# Patient Record
Sex: Female | Born: 1975
Health system: Southern US, Community
[De-identification: ages and names within clinical notes are randomized; demographics above are authoritative.]

## PROBLEM LIST (undated history)

## (undated) HISTORY — PX: CHOLECYSTECTOMY: SHX55

## (undated) HISTORY — PX: REDUCTION MAMMAPLASTY: SUR839

---
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
## 2010-04-09 ENCOUNTER — Ambulatory Visit: Payer: Self-pay | Admitting: Family Medicine

## 2010-04-09 DIAGNOSIS — L919 Hypertrophic disorder of the skin, unspecified: Secondary | ICD-10-CM

## 2010-04-09 DIAGNOSIS — L909 Atrophic disorder of skin, unspecified: Secondary | ICD-10-CM | POA: Insufficient documentation

## 2010-04-27 ENCOUNTER — Encounter: Payer: Self-pay | Admitting: Family Medicine

## 2010-04-30 LAB — CONVERTED CEMR LAB
ALT: 14 units/L (ref 0–35)
AST: 21 units/L (ref 0–37)
Albumin: 4.5 g/dL (ref 3.5–5.2)
Alkaline Phosphatase: 59 units/L (ref 39–117)
BUN: 14 mg/dL (ref 6–23)
CO2: 23 meq/L (ref 19–32)
Calcium: 9.3 mg/dL (ref 8.4–10.5)
Chloride: 103 meq/L (ref 96–112)
Cholesterol: 187 mg/dL (ref 0–200)
Creatinine, Ser: 0.71 mg/dL (ref 0.40–1.20)
Glucose, Bld: 86 mg/dL (ref 70–99)
HDL: 61 mg/dL (ref >39)
LDL Cholesterol: 110 mg/dL — ABNORMAL HIGH (ref 0–99)
Potassium: 4.5 meq/L (ref 3.5–5.3)
Sodium: 141 meq/L (ref 135–145)
Total Bilirubin: 0.6 mg/dL (ref 0.3–1.2)
Total CHOL/HDL Ratio: 3.1
Total Protein: 6.8 g/dL (ref 6.0–8.3)
Triglycerides: 81 mg/dL (ref <150)
VLDL: 16 mg/dL (ref 0–40)

## 2010-05-21 ENCOUNTER — Ambulatory Visit: Payer: Self-pay | Admitting: Family Medicine

## 2010-10-30 NOTE — Assessment & Plan Note (Signed)
Summary: NOV est care/ labs   Vital Signs:  Patient profile:   35 year old female Height:      65 inches Weight:      184 pounds BMI:     30.73 O2 Sat:      96 % on Room air Pulse rate:   78 / minute BP sitting:   124 / 84  (left arm) Cuff size:   regular  Vitals Entered By: Payton Spark CMA (April 09, 2010 3:54 PM)  O2 Flow:  Room air CC: New to est.   Primary Care Provider:  Seymour Bars DO  CC:  New to est..  History of Present Illness: 35 yo WF presents for NOV to est care.  She is G2P2, followed by [facility].  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  She feels well but would like to update her fasting labs.  She has a Mirena IUD for birth control.      Current Medications (verified): 1)  Mirena 20 Mcg/24hr Iud (Levonorgestrel)  Allergies (verified): 1)  ! 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 Married to [place].  Has 2 daughters. Never smoked. 6 ETOH/ wk. Works out 4 x a wk.  Review of Systems       no fevers/sweats/weakness, unexplained wt loss/gain, no change in vision, no difficulty hearing, ringing in ears, no hay fever/allergies, no CP/discomfort, no palpitations, no breast lump/nipple discharge, no cough/wheeze, no blood in stool, no N/V/D, no nocturia, no leaking urine, no unusual vag bleeding, no vaginal/penile discharge, no muscle/joint pain, no rash, no new/changing mole, no HA, no memory loss, no anxiety, no sleep problem, no depression, no unexplained lumps, no easy bruising/bleeding, no concern with sexual function   Physical Exam  General:  alert, well-developed, well-nourished, well-hydrated, and overweight-appearing.   Head:   normocephalic and atraumatic.   Eyes:  conjunctiva clear Mouth:  good dentition and pharynx pink and moist.   Neck:  no masses.   Lungs:  Normal respiratory effort, chest expands symmetrically. Lungs are clear to auscultation, no crackles or wheezes. Heart:  Normal rate and regular rhythm. S1 and S2 normal without gallop, murmur, click, rub or other extra sounds. Skin:  color normal.   mulitple tiny acrododons around neck Cervical Nodes:  No lymphadenopathy noted Psych:  good eye contact, not anxious appearing, and not depressed appearing.     Impression & Recommendations:  Problem # 1:  SKIN TAG (ICD-701.9) Pt will set up OV for skin tag removal in the near future and will update her fasting labs.  Complete Medication List: 1)  Mirena 20 Mcg/24hr Iud (Levonorgestrel)  Other Orders: T-Comprehensive Metabolic Panel [id number]) T-Lipid Profile ([id number])  Patient Instructions: 1)  Update fasting labs one morning at the lab downstairs. 2)  Will call you w/ results. 3)  Return for follow up as needed.

## 2010-10-30 NOTE — Assessment & Plan Note (Signed)
Summary: skin tags removal-jr   Vital Signs:  Patient profile:   35 year old female Height:      65 inches Weight:      188 pounds BMI:     31.40 O2 Sat:      97 % on Room air Pulse rate:   75 / minute BP sitting:   127 / 84  (left arm) Cuff size:   regular  Vitals Entered By: Payton Spark CMA (May 21, 2010 3:55 PM)  O2 Flow:  Room air CC: Skin tag removal   CC:  Skin tag removal.  Current Medications (verified): 1)  Mirena 20 Mcg/24hr Iud (Levonorgestrel)  Allergies (verified): 1)  ! Sulfa   Complete Medication List: 1)  Mirena 20 Mcg/24hr Iud (Levonorgestrel)  Other Orders: Skin Tags Up to 15 any area (11200)  Procedure Note  Skin Tag Removal: The patient complains of irritation. Onset of lesion: years Indication: cosmesis Consent signed: no  Procedure # 1: skin tag(s) removed    Location: neck, chest, L inner thigh, R axilla    # lesions removed: 7    Instrument used: liquid N2    Anesthesia: none    Comment: Pt tolerated procedure well w/ complications.  Will call if any problems.

## 2014-06-22 ENCOUNTER — Ambulatory Visit (INDEPENDENT_AMBULATORY_CARE_PROVIDER_SITE_OTHER): Payer: 59 | Admitting: Physician Assistant

## 2014-06-22 ENCOUNTER — Encounter: Payer: Self-pay | Admitting: Physician Assistant

## 2014-06-22 VITALS — BP 116/75 | HR 67 | Ht 66.0 in | Wt 200.0 lb

## 2014-06-22 DIAGNOSIS — K5900 Constipation, unspecified: Secondary | ICD-10-CM

## 2014-06-22 DIAGNOSIS — Z23 Encounter for immunization: Secondary | ICD-10-CM

## 2014-06-22 DIAGNOSIS — R635 Abnormal weight gain: Secondary | ICD-10-CM

## 2014-06-22 DIAGNOSIS — E669 Obesity, unspecified: Secondary | ICD-10-CM | POA: Insufficient documentation

## 2014-06-22 MED ORDER — LINACLOTIDE 145 MCG PO CAPS: 145.0000 ug | ORAL_CAPSULE | Freq: Every day | ORAL | Status: DC

## 2014-06-22 MED ORDER — PHENTERMINE HCL 37.5 MG PO TABS: 37.5000 mg | ORAL_TABLET | Freq: Every day | ORAL | Status: DC

## 2014-06-22 NOTE — Progress Notes (Signed)
   Subjective:    Patient ID: Sandy Rodriguez, female    DOB: Oct 18, 1975, 38 y.o.   MRN: [id number]  HPI Pt is a 38 yo female who presents to the clinic to establish care.   .. Active Ambulatory Problems    Diagnosis Date Noted  . SKIN TAG 04/09/2010  . Obesity, unspecified 06/22/2014  . Abnormal weight gain 06/22/2014  . CN (constipation) 06/22/2014   Resolved Ambulatory Problems    Diagnosis Date Noted  . No Resolved Ambulatory Problems   No Additional Past Medical History   .Marland Kitchen Family History  Problem Relation Age of Onset  . Hypertension Mother   . Cancer Father     non hodgkins lymphoma  . Stroke Paternal Aunt   . Heart attack Maternal Grandfather   . Stroke Paternal Grandmother    .Marland Kitchen History   Social History  . Marital Status: Married    Spouse Name: N/A    Number of Children: N/A  . Years of Education: N/A   Occupational History  . Not on file.   Social History Main Topics  . Smoking status: Never Smoker   . Smokeless tobacco: Not on file  . Alcohol Use: Yes  . Drug Use: No  . Sexual Activity: Yes   Other Topics Concern  . Not on file   Social History Narrative  . No narrative on file    Pt has ongoing constipation for last 3 years since gallbladder was removed. She occasional has some discomfort but mostly hard stools that occur every 2-5 days. Tried miralax and mag citrate. Helps some. Would like to try medication. There is some straining with bowel movements. Denies hemorrhoids or blood in stool.   She also wants help losing weight. She feels like she is eating better and exercising 2-3 times a week with no results. Never tried anything.    Review of Systems  All other systems reviewed and are negative.      Objective:   Physical Exam  Constitutional: She is oriented to person, place, and time. She appears well-developed and well-nourished.  Obese.   HENT:  Head: Normocephalic and atraumatic.  Cardiovascular: Normal rate, regular rhythm  and normal heart sounds.   Pulmonary/Chest: Effort normal and breath sounds normal.  Abdominal: Soft. Bowel sounds are normal. She exhibits no distension and no mass. There is no tenderness. There is no rebound and no guarding.  Neurological: She is alert and oriented to person, place, and time.  Skin: Skin is dry.  Psychiatric: She has a normal mood and affect. Her behavior is normal.          Assessment & Plan:  Constipation- will try linzess once daily. Gave free coupon card. Follow up in one month. Discussed SE.   Abnormal weight gain/obesity- started phentermine. Discussed diet and exercise with medication. SE discussed. Follow up in one month.   Tdap given without complication.  CPE in one month.

## 2014-07-12 ENCOUNTER — Telehealth: Payer: Self-pay

## 2014-07-12 NOTE — Telephone Encounter (Signed)
Linzess is not covered by insurance. I left a message on patient's phone for her to return call. Jade recommends trying Amitiza.

## 2014-07-13 MED ORDER — LUBIPROSTONE 24 MCG PO CAPS: 24.0000 ug | ORAL_CAPSULE | Freq: Two times a day (BID) | ORAL | Status: DC

## 2014-07-13 NOTE — Telephone Encounter (Signed)
rx sent

## 2014-07-13 NOTE — Telephone Encounter (Signed)
Patient agreed with Sandy Rodriguez's recommendation of Amitiza. Please prescribe. I'm not sure about the dosing.

## 2014-07-19 ENCOUNTER — Telehealth: Payer: Self-pay | Admitting: *Deleted

## 2014-07-19 NOTE — Telephone Encounter (Signed)
Amitiza was denied and Linzess was also denied. What would you want to try now? Corliss SkainsJamie Painter, CMA

## 2014-07-20 ENCOUNTER — Ambulatory Visit (INDEPENDENT_AMBULATORY_CARE_PROVIDER_SITE_OTHER): Payer: 59 | Admitting: Physician Assistant

## 2014-07-20 ENCOUNTER — Encounter: Payer: Self-pay | Admitting: Physician Assistant

## 2014-07-20 VITALS — BP 114/71 | HR 84 | Ht 66.0 in | Wt 191.0 lb

## 2014-07-20 DIAGNOSIS — Z131 Encounter for screening for diabetes mellitus: Secondary | ICD-10-CM

## 2014-07-20 DIAGNOSIS — Z13220 Encounter for screening for lipoid disorders: Secondary | ICD-10-CM

## 2014-07-20 DIAGNOSIS — R635 Abnormal weight gain: Secondary | ICD-10-CM

## 2014-07-20 DIAGNOSIS — Z0000 Encounter for general adult medical examination without abnormal findings: Secondary | ICD-10-CM

## 2014-07-20 DIAGNOSIS — K5900 Constipation, unspecified: Secondary | ICD-10-CM

## 2014-07-20 MED ORDER — PHENTERMINE HCL 37.5 MG PO TABS: 37.5000 mg | ORAL_TABLET | Freq: Every day | ORAL | Status: DC

## 2014-07-20 NOTE — Patient Instructions (Signed)

## 2014-07-20 NOTE — Progress Notes (Signed)
Subjective:    Patient ID: Sandy Rodriguez, female    DOB: 01/08/1976, 38 y.o.   MRN: [id number]  HPI    Review of Systems     Objective:   Physical Exam        Assessment & Plan:   Subjective:     Sandy Rodriguez is a 38 y.o. female and is here for a comprehensive physical exam. The patient reports problems - see below..  Taken linzess for one month. Bowel movements much better at least every 2-3 days but still feels like not completely emptying. It was also not approved by insurance and too expensive. We sent over amitiza but not picked up yet. She has tried miralax daily for years with no benefit.   Pt is also on phentermine and doing great. Denies any side effects except a little dry mouth. Doing a spin class daily and trying to make diet changes. 9lb weight loss this month.   History   Social History  . Marital Status: Married    Spouse Name: N/A    Number of Children: N/A  . Years of Education: N/A   Occupational History  . Not on file.   Social History Main Topics  . Smoking status: Never Smoker   . Smokeless tobacco: Not on file  . Alcohol Use: Yes  . Drug Use: No  . Sexual Activity: Yes   Other Topics Concern  . Not on file   Social History Narrative  . No narrative on file   Health Maintenance  Topic Date Due  . Influenza Vaccine  07/22/2014 (Originally 04/30/2014)  . Pap Smear  01/27/2015  . Tetanus/tdap  06/22/2024    The following portions of the patient's history were reviewed and updated as appropriate: allergies, current medications, past family history, past medical history, past social history, past surgical history and problem list.  Review of Systems A comprehensive review of systems was negative.   Objective:    BP 114/71  Pulse 84  Ht 5\' 6"  (1.676 m)  Wt 191 lb (86.637 kg)  BMI 30.84 kg/m2 General appearance: alert, cooperative and appears stated age Head: Normocephalic, without obvious abnormality, atraumatic Eyes:  conjunctivae/corneas clear. PERRL, EOM's intact. Fundi benign. Ears: normal TM's and external ear canals both ears Nose: Nares normal. Septum midline. Mucosa normal. No drainage or sinus tenderness. Throat: lips, mucosa, and tongue normal; teeth and gums normal Neck: no adenopathy, no carotid bruit, no JVD, supple, symmetrical, trachea midline and thyroid not enlarged, symmetric, no tenderness/mass/nodules Back: symmetric, no curvature. ROM normal. No CVA tenderness. Lungs: clear to auscultation bilaterally Heart: regular rate and rhythm, S1, S2 normal, no murmur, click, rub or gallop Abdomen: soft, non-tender; bowel sounds normal; no masses,  no organomegaly Extremities: extremities normal, atraumatic, no cyanosis or edema Pulses: 2+ and symmetric Skin: Skin color, texture, turgor normal. No rashes or lesions Lymph nodes: Cervical, supraclavicular, and axillary nodes normal. Neurologic: Grossly normal    Assessment:    Healthy female exam.      Plan:    CPE- fasting labs given. Immunizations up to date. Encouraged calcium 1200mg  and vitamin D 800 units. Pt scheduled for pap in next month at OB/GYN.   Constipation- given amitiza card to activate and rx at pharmacy. Discussed this medication is twice a day. Follow up in 2 months.   Abnormal weight gain/obesity- refilled phentermine for 2 months. Nurse visit in 2 months. Encouraged to make diet changes and increase intensity of exercise.  See  After Visit Summary for Counseling Recommendations

## 2014-07-20 NOTE — Telephone Encounter (Signed)
She did not have coupon card for amitiza we are going to try that. She came in for cpe today.

## 2014-07-21 LAB — COMPLETE METABOLIC PANEL WITH GFR
ALT: 11 U/L (ref 0–35)
AST: 17 U/L (ref 0–37)
Albumin: 4.4 g/dL (ref 3.5–5.2)
Alkaline Phosphatase: 44 U/L (ref 39–117)
BUN: 11 mg/dL (ref 6–23)
CO2: 27 mEq/L (ref 19–32)
Calcium: 9.4 mg/dL (ref 8.4–10.5)
Chloride: 105 mEq/L (ref 96–112)
Creat: 0.73 mg/dL (ref 0.50–1.10)
GFR, Est African American: >89 mL/min
GFR, Est Non African American: >89 mL/min
Glucose, Bld: 82 mg/dL (ref 70–99)
Potassium: 4.4 mEq/L (ref 3.5–5.3)
Sodium: 140 mEq/L (ref 135–145)
Total Bilirubin: 0.7 mg/dL (ref 0.2–1.2)
Total Protein: 6.9 g/dL (ref 6.0–8.3)

## 2014-07-21 LAB — LIPID PANEL
Cholesterol: 160 mg/dL (ref 0–200)
HDL: 53 mg/dL (ref >39)
LDL Cholesterol: 97 mg/dL (ref 0–99)
Total CHOL/HDL Ratio: 3 Ratio
Triglycerides: 48 mg/dL (ref <150)
VLDL: 10 mg/dL (ref 0–40)

## 2014-07-21 LAB — TSH: TSH: 0.972 u[IU]/mL (ref 0.350–4.500)

## 2014-09-02 ENCOUNTER — Telehealth: Payer: Self-pay | Admitting: *Deleted

## 2014-09-02 ENCOUNTER — Other Ambulatory Visit: Payer: Self-pay | Admitting: Physician Assistant

## 2014-09-02 NOTE — Telephone Encounter (Signed)
Have you tried magnesium citrate twice a day as needed for constipation?

## 2014-09-02 NOTE — Telephone Encounter (Signed)
Pt.notified

## 2014-09-02 NOTE — Telephone Encounter (Signed)
Spoke with pt this morning regarding her ins denying linzess AND amitiza.  She contacted them herself & got a list of what they will approve and both of those meds are on the list.  I'm having her fax me the info she has and see if Asher MuirJamie can contact them again.  While she was taking the linzess samples she was going to the bathroom and felt better.  She is now back to pretty bad constipation.  She did an enema on Sunday and did have a BM but it wasn't as big as she expected it to be.  She is doing miralax now as well as warm prune juice-neither are helping.  Tried colace in the past but has not tried a straight laxative.  She is wanting to know what suggestions you have while we wait for another prior auth.

## 2015-03-10 ENCOUNTER — Emergency Department (INDEPENDENT_AMBULATORY_CARE_PROVIDER_SITE_OTHER)
Admission: EM | Admit: 2015-03-10 | Discharge: 2015-03-10 | Disposition: A | Discharge status: Home / Self Care | Payer: BLUE CROSS/BLUE SHIELD | Source: Home / Self Care | Attending: Family Medicine | Admitting: Family Medicine

## 2015-03-10 ENCOUNTER — Encounter: Payer: Self-pay | Admitting: *Deleted

## 2015-03-10 DIAGNOSIS — R110 Nausea: Secondary | ICD-10-CM

## 2015-03-10 DIAGNOSIS — R197 Diarrhea, unspecified: Secondary | ICD-10-CM

## 2015-03-10 LAB — POCT CBC W AUTO DIFF (K'VILLE URGENT CARE)

## 2015-03-10 LAB — POCT URINALYSIS DIPSTICK
Bilirubin, UA: NEGATIVE
Blood, UA: NEGATIVE
Glucose, UA: NEGATIVE
Ketones, UA: NEGATIVE
Leukocytes, UA: NEGATIVE
Nitrite, UA: NEGATIVE
Protein, UA: NEGATIVE
Spec Grav, UA: 1.01 (ref 1.005–1.03)
Urobilinogen, UA: 0.2 (ref 0–1)
pH, UA: 7.5 (ref 5–8)

## 2015-03-10 MED ORDER — ONDANSETRON 4 MG PO TBDP: 4.0000 mg | ORAL_TABLET | Freq: Three times a day (TID) | ORAL | Status: DC | PRN

## 2015-03-10 NOTE — ED Provider Notes (Signed)
CSN: [id number]     Arrival date & time 03/10/15  1526 History   First MD Initiated Contact with Patient 03/10/15 1551     Chief Complaint  Patient presents with  . Headache  . Nausea      HPI Comments: Three days ago patient developed headache and fatigue.  Last night she developed myalgias, chills, sweats, nausea without vomiting, and watery diarrhea.  No abdominal pain.  Denies recent foreign travel, or drinking untreated water in a wilderness environment.  She denies recent antibiotic use.   Patient is a 39 y.o. female presenting with diarrhea. The history is provided by the patient.  Diarrhea Quality:  Watery Severity:  Moderate Onset quality:  Sudden Duration:  1 day Timing:  Intermittent Progression:  Unchanged Relieved by:  None tried Exacerbated by: food. Ineffective treatments:  None tried Associated symptoms: chills, diaphoresis and headaches   Associated symptoms: no abdominal pain, no arthralgias, no recent cough, no fever, no myalgias, no URI and no vomiting   Risk factors: no recent antibiotic use, no sick contacts, no suspicious food intake and no travel to endemic areas     History reviewed. No pertinent past medical history. Past Surgical History  Procedure Laterality Date  . Cholecystectomy    . Breast reduction surgery  1998   Family History  Problem Relation Age of Onset  . Hypertension Mother   . Cancer Father     non hodgkins lymphoma  . Stroke Paternal Aunt   . Heart attack Maternal Grandfather   . Stroke Paternal Grandmother    History  Substance Use Topics  . Smoking status: Never Smoker   . Smokeless tobacco: Not on file  . Alcohol Use: Yes   OB History    No data available     Review of Systems  Constitutional: Positive for chills and diaphoresis. Negative for fever.  Gastrointestinal: Positive for diarrhea. Negative for vomiting and abdominal pain.  Musculoskeletal: Negative for myalgias and arthralgias.  Neurological: Positive for  headaches.  All other systems reviewed and are negative.   Allergies  Sulfonamide derivatives  Home Medications   Prior to Admission medications   Medication Sig Start Date End Date Taking? Authorizing Provider  lubiprostone (AMITIZA) 24 MCG capsule Take 1 capsule (24 mcg total) by mouth 2 (two) times daily with a meal. 07/13/14   Agapito Games, MD  Multiple Vitamin (MULTIVITAMIN) tablet Take 1 tablet by mouth daily.    Historical Provider, MD  ondansetron (ZOFRAN ODT) 4 MG disintegrating tablet Take 1 tablet (4 mg total) by mouth every 8 (eight) hours as needed for nausea or vomiting. Dissolve under tongue 03/10/15   Lattie Haw, MD   BP 137/87 mmHg  Pulse 99  Temp(Src) 98.6 F (37 C) (Oral)  Resp 16  Ht 5\' 6"  (1.676 m)  Wt 205 lb (92.987 kg)  BMI 33.10 kg/m2  SpO2 97% Physical Exam Nursing notes and Vital Signs reviewed. Appearance:  Patient appears stated age, and in no acute distress.  Patient is obese (BMI 33.1) Eyes:  Pupils are equal, round, and reactive to light and accomodation.  Extraocular movement is intact.  Conjunctivae are not inflamed  Ears:  Canals normal.  Tympanic membranes normal.  Nose:   Normal turbinates.  No sinus tenderness.   Pharynx:  Normal; moist mucous membranes  Neck:  Supple.  No adenopathy Lungs:  Clear to auscultation.  Breath sounds are equal.  Heart:  Regular rate and rhythm without murmurs, rubs, or gallops.  Abdomen:  Nontender without masses or hepatosplenomegaly.  Bowel sounds are present and increased.  No CVA or flank tenderness.  Extremities:  No edema.  No calf tenderness Skin:  No rash present.   ED Course  Procedures  None    Labs Reviewed  POCT CBC W AUTO DIFF ([facility]):  WBC 9.2; LY 20.9; MO 8.4; GR 70.7; Hgb 14.1; Platelets 254   POCT URINALYSIS DIPSTICK:  Negative.  SG 1.010      MDM   1. Nausea without vomiting; suspect viral gastroenteritis  2. Diarrhea    Zofran ODT  administered PO Rx  for Zofran. Begin clear liquids (Pedialyte while having diarrhea) until improved, then advance to a [employer] (Bananas, Rice, Applesauce, Toast). Then gradually resume a regular diet when tolerated.  Avoid milk products until well.  When stools become more formed, may take Imodium (loperamide) once or twice daily to decrease stool frequency.  If symptoms become significantly worse during the night or over the weekend, proceed to the local emergency room. Followup with Family Doctor if not improved in about four days.    Lattie Haw, MD 03/18/15 [phone]

## 2015-03-10 NOTE — ED Notes (Signed)
Pt c/o HA x 3-4 days. Nausea, chills and sweats since last night/this AM. Taken advil.

## 2015-03-10 NOTE — Discharge Instructions (Signed)
Begin clear liquids (Pedialyte while having diarrhea) until improved, then advance to a BRAT diet (Bananas, Rice, Applesauce, Toast).  Then gradually resume a regular diet when tolerated.  Avoid milk products until well.  When stools become more formed, may take Imodium (loperamide) once or twice daily to decrease stool frequency.  °If symptoms become significantly worse during the night or over the weekend, proceed to the local emergency room.  °

## 2015-03-13 ENCOUNTER — Encounter: Payer: Self-pay | Admitting: Emergency Medicine

## 2015-03-13 ENCOUNTER — Emergency Department (INDEPENDENT_AMBULATORY_CARE_PROVIDER_SITE_OTHER)
Admission: EM | Admit: 2015-03-13 | Discharge: 2015-03-13 | Disposition: A | Discharge status: Home / Self Care | Payer: BLUE CROSS/BLUE SHIELD | Source: Home / Self Care | Attending: Emergency Medicine | Admitting: Emergency Medicine

## 2015-03-13 ENCOUNTER — Telehealth: Payer: Self-pay | Admitting: *Deleted

## 2015-03-13 DIAGNOSIS — R509 Fever, unspecified: Secondary | ICD-10-CM | POA: Diagnosis not present

## 2015-03-13 DIAGNOSIS — E860 Dehydration: Secondary | ICD-10-CM

## 2015-03-13 NOTE — Telephone Encounter (Signed)
The Urgent Care note is not complete so I can't provide any recommendations other than schedule an appointment to further look into her illness.

## 2015-03-13 NOTE — ED Notes (Signed)
Pt c/o severe neck pain, HA, N+V and fever. She was seen here in [facility] Friday for same sxs but states they have gotten much worse/

## 2015-03-13 NOTE — ED Provider Notes (Signed)
CSN: [id number]     Arrival date & time 03/13/15  1508 History   First MD Initiated Contact with Patient 03/13/15 1516    [facility] Chief Complaint  Patient presents with  . Neck Pain   Husband brings her in HPI Worsening fever chills, fatigue, nausea, vomiting, neck pain and stiffness, arthralgias especially in her knees. Diffuse headache that is the worst headache of her life. Onset 3 days ago, progressively worsening, much worse in the past 12 hours. Zofran and ibuprofen helped a little bit. Has not been able to tolerate any by mouth's the past 8 hours. She does not recall any tick bite, but she and husband hike in the woods. Denies any new rash No foreign travel recently. Denies any new abdominal pain. Denies diarrhea. No chest pain or shortness of breath. No coryza. History reviewed. No pertinent past medical history. Past Surgical History  Procedure Laterality Date  . Cholecystectomy    . Breast reduction surgery  1998   Family History  Problem Relation Age of Onset  . Hypertension Mother   . Cancer Father     non hodgkins lymphoma  . Stroke Paternal Aunt   . Heart attack Maternal Grandfather   . Stroke Paternal Grandmother    History  Substance Use Topics  . Smoking status: Never Smoker   . Smokeless tobacco: Not on file  . Alcohol Use: Yes   OB History    No data available     Review of Systems Denies chance of pregnancy. Has IUD No GYN or GU symptoms. Remainder of Review of Systems negative for acute change except as noted in the HPI.  Allergies  Sulfonamide derivatives  Home Medications   Prior to Admission medications   Medication Sig Start Date End Date Taking? Authorizing Provider  lubiprostone (AMITIZA) 24 MCG capsule Take 1 capsule (24 mcg total) by mouth 2 (two) times daily with a meal. 07/13/14   Agapito Games, MD  Multiple Vitamin (MULTIVITAMIN) tablet Take 1 tablet by mouth daily.    Historical Provider, MD   ondansetron (ZOFRAN ODT) 4 MG disintegrating tablet Take 1 tablet (4 mg total) by mouth every 8 (eight) hours as needed for nausea or vomiting. Dissolve under tongue 03/10/15   Lattie Haw, MD   BP 126/86 mmHg  Pulse 108  Temp(Src) 99.7 F (37.6 C) (Oral)  SpO2 99% Physical Exam  Constitutional: She appears well-developed and well-nourished.  Non-toxic appearance. She appears ill (very fatigued, but no cardiorespiratory distress). She appears distressed.  Appears very uncomfortable. Fatigue. Alert, cooperative.  HENT:  Head: Normocephalic and atraumatic.  Right Ear: Tympanic membrane and external ear normal.  Left Ear: Tympanic membrane and external ear normal.  Nose: Rhinorrhea present.  Mouth/Throat: Mucous membranes are dry. No oropharyngeal exudate or posterior oropharyngeal erythema.  Eyes: Conjunctivae are normal. Right eye exhibits no discharge. Left eye exhibits no discharge. No scleral icterus.  Neck: Decreased range of motion present.  Positive Neck rigidity.  Cardiovascular: Normal rate, regular rhythm and normal heart sounds.   Pulmonary/Chest: Breath sounds normal. No stridor. No respiratory distress. She has no wheezes. She has no rales.  Abdominal: Soft. Bowel sounds are normal. There is no tenderness. There is no rigidity, no rebound, no guarding, no CVA tenderness, no tenderness at McBurney's point and negative Murphy's sign.  Musculoskeletal: She exhibits no edema.  Positive for nonspecific tenderness of both knees, but no redness or hot swollen joints  Lymphadenopathy:    She has cervical  adenopathy (mild shoddy anterior cervical nodes).  Neurological: She is alert. She has normal reflexes. No cranial nerve deficit or sensory deficit. She displays no seizure activity.  Skin: Skin is warm and intact. No rash noted. She is diaphoretic.  Psychiatric: She has a normal mood and affect.  Nursing note and vitals reviewed.   ED Course  Procedures (including critical  care time) Labs Review Labs Reviewed - No data to display  Imaging Review No results found.   MDM   1. Febrile illness, acute   2. Dehydration    in the differential is meningitis, RMSF, Lyme's disease, influenza-like illness, or other viral illnesses. Explained to patient and husband that she needs further evaluation and treatment at [facility] emergency room, that she may need CT scan of head, lumbar puncture, and a variety of other tests, as well as IV fluids for dehydration. Patient and husband declined ambulance. Husband driving her right now to [facility] emergency room.    Lajean Manes, MD 03/13/15 [phone]

## 2015-03-13 NOTE — Telephone Encounter (Signed)
Pt left vm stating that she was seen in [facility] on Friday and is feeling no better.  She stated that she feels body aches and like she's "been hit by a truck".  Any recommendations for her other than try to get a f/u appt? Please advise.

## 2015-06-13 ENCOUNTER — Ambulatory Visit (INDEPENDENT_AMBULATORY_CARE_PROVIDER_SITE_OTHER): Payer: BLUE CROSS/BLUE SHIELD | Admitting: Physician Assistant

## 2015-06-13 ENCOUNTER — Encounter: Payer: Self-pay | Admitting: Physician Assistant

## 2015-06-13 VITALS — BP 128/91 | HR 68 | Ht 66.0 in | Wt 205.0 lb

## 2015-06-13 DIAGNOSIS — J309 Allergic rhinitis, unspecified: Secondary | ICD-10-CM

## 2015-06-13 DIAGNOSIS — K5900 Constipation, unspecified: Secondary | ICD-10-CM

## 2015-06-13 DIAGNOSIS — E669 Obesity, unspecified: Secondary | ICD-10-CM

## 2015-06-13 DIAGNOSIS — R635 Abnormal weight gain: Secondary | ICD-10-CM | POA: Diagnosis not present

## 2015-06-13 MED ORDER — LINACLOTIDE 145 MCG PO CAPS: 145.0000 ug | ORAL_CAPSULE | Freq: Every day | ORAL | Status: DC

## 2015-06-13 MED ORDER — LORCASERIN HCL 10 MG PO TABS: ORAL_TABLET | ORAL | Status: DC

## 2015-06-13 MED ORDER — PREDNISONE 20 MG PO TABS: ORAL_TABLET | ORAL | Status: DC

## 2015-06-13 NOTE — Patient Instructions (Signed)
flonase 2 sprays each nostril.  Prednisone taper.  zyrtec

## 2015-06-13 NOTE — Progress Notes (Addendum)
   Subjective:    Patient ID: Sandy Rodriguez, female    DOB: 1975-10-27, 39 y.o.   MRN: [id number]  HPI  Patient presents to clinic today with complaints of constipation and sinus pain. She reports an average of 1-3 bowel movements per week with hard stools. She states bowel movements require strenuous effort and are often painful. She has a diet high in fiber and drinks plenty of water. She drinks prune juice regularly and uses miralax with minimal relief. She has tried linzess in the past with relief of symptoms, but discontinued medication due to insurance coverage reasons. She has experienced constipation since childhood, but states symptoms have increased since having children. She denies N/V/D. Patient is experiencing sinus pain and tenderness without sneezing, discharge, or congestion. She states sinus pain is exacerbated by changing position, and localizes to her hard palate. She was diagnosed with sinusitis in ED in June of this year, which was confirmed by a CT scan. She reports today's symptoms as similar to that time. She was treated with 4 days of prednisone and 10 days of erythromycin. She denies sneezing, itching, and nasal drainage.     Review of Systems Positive for symptoms listed in the HPI    Objective:   Physical Exam  Cardiovascular: Normal rate, regular rhythm and normal heart sounds.   Pulmonary/Chest: Effort normal and breath sounds normal.   Eyes: PERRLA Nose: No discharge observed. Frontal and maxillary sinuses tender to palpation.  Mouth: No pharyngeal erythema or exudate observed GI: Not able to auscultate bowel sounds. All four quadrants tender to palpation.        Assessment & Plan:  Constipation- Initiate treatment with linzess. Pt has previously tolerated linzess but no approved via cost. Follow-up in 6-8 weeks to evaluate symptom improvement  Sinusitis- I feel like symptoms are more allergic than bacterial. Initiate treatment with prednisone taper, oral  antihistamines, and intranasal steroids. If not improving may consider another round of abx toward end of week. If no symptom improvement, consider referral to ENT.  Abnormal weight gain/overweight- discussed she is not a candidate for phentermine due to the side effect of constipation at this time. Discussed other medications such as belviq for weight loss. She decided to start with belviq. Discussed side effects. Combine with diet and exercise. Follow up in 2 months.   Reviewed by Tandy Gaw PA-C

## 2015-06-14 DIAGNOSIS — Z6833 Body mass index (BMI) 33.0-33.9, adult: Secondary | ICD-10-CM

## 2015-06-14 DIAGNOSIS — E6609 Other obesity due to excess calories: Secondary | ICD-10-CM | POA: Insufficient documentation

## 2015-06-14 DIAGNOSIS — J309 Allergic rhinitis, unspecified: Secondary | ICD-10-CM | POA: Insufficient documentation

## 2015-06-14 NOTE — Addendum Note (Signed)
Addended by: Jomarie Longs on: 06/14/2015 08:12 AM   Modules accepted: Level of Service

## 2015-06-27 ENCOUNTER — Telehealth: Payer: Self-pay | Admitting: Physician Assistant

## 2015-06-27 NOTE — Telephone Encounter (Signed)
Received fax for prior authorization on Belviq  sent through cover my meds waiting on authorization. - CF

## 2015-06-29 NOTE — Telephone Encounter (Signed)
Received fax from [name] and Sandy Rodriguez is approved from 06/27/2015 - 12/23/2015. Reference # [id number]. Pharmacy has been notified. - CF

## 2015-08-10 ENCOUNTER — Telehealth: Payer: Self-pay | Admitting: *Deleted

## 2015-08-10 NOTE — Telephone Encounter (Signed)
Pt left vm stating that the Linzess has been working well but she is not liking the Belviq.  She stated that you had mentioned possibly going back to phentermine once her constipation was under control so she wanted to know if you could do that for her.  She stated that the belviq makes her feel weird.  Please advise.

## 2015-08-11 NOTE — Telephone Encounter (Signed)
Call pt: where would you like phentermine faxed? Ok for 37.5mg  once daily. Start with 1/2 tablet. If makes constipation worse need to stop. Follow up with me in one month.

## 2015-08-31 NOTE — Telephone Encounter (Signed)
Rx was faxed to pharm on file since pt never returned my call.

## 2015-12-18 ENCOUNTER — Encounter: Payer: Self-pay | Admitting: Emergency Medicine

## 2015-12-18 ENCOUNTER — Emergency Department (INDEPENDENT_AMBULATORY_CARE_PROVIDER_SITE_OTHER)
Admission: EM | Admit: 2015-12-18 | Discharge: 2015-12-18 | Disposition: A | Discharge status: Home / Self Care | Payer: BLUE CROSS/BLUE SHIELD | Source: Home / Self Care | Attending: Family Medicine | Admitting: Family Medicine

## 2015-12-18 DIAGNOSIS — L03012 Cellulitis of left finger: Secondary | ICD-10-CM | POA: Diagnosis not present

## 2015-12-18 MED ORDER — DOXYCYCLINE HYCLATE 100 MG PO CAPS: 100.0000 mg | ORAL_CAPSULE | Freq: Two times a day (BID) | ORAL | Status: DC

## 2015-12-18 NOTE — ED Provider Notes (Signed)
CSN: [id number]     Arrival date & time 12/18/15  1701 History   First MD Initiated Contact with Patient 12/18/15 1703     Chief Complaint  Patient presents with  . Hand Pain   (Consider location/radiation/quality/duration/timing/severity/associated sxs/prior Treatment) HPI  The pt is a 40yo female presenting to [facility] with c/o Left thumb pain, redness and swelling that started about 3-4 days ago after cutting her thumb on an aluminum foil box edge 1 week ago.  She had been using peroxide and Neosporin but the cut closed up and redness, pain and swelling continued. She has mild to moderate aching dose pain, worse with palpation and movement. No bleeding or discharge. Denies fever, chills, n/v/d. Pt is Right hand dominant.   History reviewed. No pertinent past medical history. Past Surgical History  Procedure Laterality Date  . Cholecystectomy    . Breast reduction surgery  1998   Family History  Problem Relation Age of Onset  . Hypertension Mother   . Cancer Father     non hodgkins lymphoma  . Stroke Paternal Aunt   . Heart attack Maternal Grandfather   . Stroke Paternal Grandmother    Social History  Substance Use Topics  . Smoking status: Never Smoker   . Smokeless tobacco: None  . Alcohol Use: Yes   OB History    No data available     Review of Systems  Constitutional: Negative for fever and chills.  Gastrointestinal: Negative for nausea and vomiting.  Musculoskeletal: Positive for myalgias, joint swelling and arthralgias.       Left thumb  Skin: Positive for color change and wound. Negative for rash.  Neurological: Negative for weakness and numbness.    Allergies  Belviq and Sulfonamide derivatives  Home Medications   Prior to Admission medications   Medication Sig Start Date End Date Taking? Authorizing Provider  doxycycline (VIBRAMYCIN) 100 MG capsule Take 1 capsule (100 mg total) by mouth 2 (two) times daily. One po bid x 7 days 12/18/15   Junius Finner, PA-C   Linaclotide [facility]) 145 MCG CAPS capsule Take 1 capsule (145 mcg total) by mouth daily. 06/13/15   Jade L Breeback, PA-C  Lorcaserin HCl (BELVIQ) 10 MG TABS Take 1 tablet twice a day. 06/13/15   Jade L Breeback, PA-C  Multiple Vitamin (MULTIVITAMIN) tablet Take 1 tablet by mouth daily.    Historical Provider, MD  predniSONE (DELTASONE) 20 MG tablet Take 3 tablets for 3 days, take 2 tablets for 3 days, take 1 tablet for 3 days take 1/2 tablet for 4 days. 06/13/15   Jomarie Longs, PA-C   Meds Ordered and Administered this Visit  Medications - No data to display  BP 124/85 mmHg  Pulse 71  Temp(Src) 98.1 F (36.7 C) (Oral)  Ht  (1.676 m)  Wt 208 lb (94.348 kg)  BMI 33.59 kg/m2  SpO2 98% No data found.   Physical Exam  Constitutional: She is oriented to person, place, and time. She appears well-developed and well-nourished.  HENT:  Head: Normocephalic and atraumatic.  Eyes: EOM are normal.  Neck: Normal range of motion.  Cardiovascular: Normal rate.   Left thumb: cap refill < 3 seconds  Pulmonary/Chest: Effort normal.  Musculoskeletal: She exhibits edema and tenderness.  Left thumb: mild edema to proximal phalanx with tenderness to volar aspect. Limited flexion at PIP due to pain.  Full ROM at MCP joint.    Neurological: She is alert and oriented to person, place, and time.  Left thumb: normal sensation  Skin: Skin is warm and dry. No rash noted. There is erythema.  Left thumb: skin in tact. Well healed superficial laceration to volar aspect of proximal thumb.  Mild edema, tender to touch. No active bleeding or discharge. No foreign bodies seen or palpated. No fluctuance.   Psychiatric: She has a normal mood and affect. Her behavior is normal.  Nursing note and vitals reviewed.   ED Course  Procedures (including critical care time)  Labs Review Labs Reviewed - No data to display  Imaging Review No results found.    MDM   1. Cellulitis of left thumb     Pt  concerned for thumb infection after cut on aluminum foil box about 1 week ago. Tetanus UTD.  No open wound at this time.   Will tx for cellulitis. Pt denies chance of pregnancy or attempt to become pregnant. Rx: Doxycycline   Encouraged to f/u in 3-4 days if not improving, sooner if worsening. May use warm compresses or warm soaks, keep hand elevated. Patient verbalized understanding and agreement with treatment plan.     Junius FinnerErin O'Malley, PA-C 12/18/15 1725

## 2015-12-18 NOTE — ED Notes (Signed)
Left thumb pain cut a few days ago on aluminum foil box edge, Tetanus 3 years ago

## 2015-12-25 ENCOUNTER — Telehealth: Payer: Self-pay | Admitting: *Deleted

## 2015-12-25 MED ORDER — FLUCONAZOLE 150 MG PO TABS: 150.0000 mg | ORAL_TABLET | Freq: Every day | ORAL | Status: DC

## 2016-09-30 ENCOUNTER — Emergency Department (INDEPENDENT_AMBULATORY_CARE_PROVIDER_SITE_OTHER)
Admission: EM | Admit: 2016-09-30 | Discharge: 2016-09-30 | Disposition: A | Discharge status: Home / Self Care | Payer: BLUE CROSS/BLUE SHIELD | Source: Home / Self Care | Attending: Family Medicine | Admitting: Family Medicine

## 2016-09-30 ENCOUNTER — Telehealth: Payer: Self-pay | Admitting: *Deleted

## 2016-09-30 ENCOUNTER — Encounter: Payer: Self-pay | Admitting: *Deleted

## 2016-09-30 DIAGNOSIS — J069 Acute upper respiratory infection, unspecified: Secondary | ICD-10-CM | POA: Diagnosis not present

## 2016-09-30 DIAGNOSIS — B9789 Other viral agents as the cause of diseases classified elsewhere: Secondary | ICD-10-CM | POA: Diagnosis not present

## 2016-09-30 MED ORDER — FLUCONAZOLE 200 MG PO TABS: 200.0000 mg | ORAL_TABLET | Freq: Once | ORAL | 1 refills | Status: AC

## 2016-09-30 MED ORDER — BENZONATATE 200 MG PO CAPS: ORAL_CAPSULE | ORAL | 0 refills | Status: DC

## 2016-09-30 MED ORDER — AZITHROMYCIN 250 MG PO TABS: ORAL_TABLET | ORAL | 0 refills | Status: DC

## 2016-09-30 NOTE — ED Triage Notes (Signed)
Patient c/o productive cough, nasal congestion and facial pain. Taken Tylenol cold/sinus otc.

## 2016-09-30 NOTE — Discharge Instructions (Signed)
Take plain guaifenesin (1200mg  extended release tabs such as Mucinex) twice daily, with plenty of water, for cough and congestion.  May add Pseudoephedrine (30mg , one or two every 4 to 6 hours) for sinus congestion.  Get adequate rest.   May use Afrin nasal spray (or generic oxymetazoline) twice daily for about 5 days and then discontinue.  Also recommend using saline nasal spray several times daily and saline nasal irrigation (AYR is a common brand).  Use Flonase nasal spray each morning after using Afrin nasal spray and saline nasal irrigation. Try warm salt water gargles for sore throat.  Stop all antihistamines for now, and other non-prescription cough/cold preparations. Begin Azithromycin if not improving about one week or if persistent fever develops (Given a prescription to hold, with an expiration date)  Follow-up with family doctor if not improving about10 days.

## 2016-09-30 NOTE — ED Provider Notes (Signed)
[facility]    CSN: [id number] Arrival date & time: 09/30/16  0840     History   Chief Complaint Chief Complaint  Patient presents with  . Cough  . Nasal Congestion    HPI AERICA Rodriguez is a 41 y.o. female.   One week ago patient developed typical cold-like symptoms developing over several days, including mild sore throat, sinus congestion, fatigue, and cough.  Yesterday she developed chills.  Her ears feel clogged and this morning she felt pain in her anterior chest.   The history is provided by the patient.    History reviewed. No pertinent past medical history.  Patient Active Problem List   Diagnosis Date Noted  . Allergic sinusitis 06/14/2015  . Obesity 06/14/2015  . Obesity, unspecified 06/22/2014  . Abnormal weight gain 06/22/2014  . CN (constipation) 06/22/2014  . SKIN TAG 04/09/2010    Past Surgical History:  Procedure Laterality Date  . BREAST REDUCTION SURGERY  1998  . CHOLECYSTECTOMY      OB History    No data available       Home Medications    Prior to Admission medications   Medication Sig Start Date End Date Taking? Authorizing Provider  azithromycin (ZITHROMAX Z-PAK) 250 MG tablet Take 2 tabs today; then begin one tab once daily for 4 more days. (Rx void after 10/07/16) 09/30/16   Lattie Haw, MD  benzonatate (TESSALON) 200 MG capsule Take one cap by mouth at bedtime as needed for cough.  May repeat in 4 to 6 hours 09/30/16   Lattie Haw, MD  Linaclotide [facility]) 145 MCG CAPS capsule Take 1 capsule (145 mcg total) by mouth daily. 06/13/15   Jade L Breeback, PA-C  Lorcaserin HCl (BELVIQ) 10 MG TABS Take 1 tablet twice a day. 06/13/15   Jade L Breeback, PA-C  Multiple Vitamin (MULTIVITAMIN) tablet Take 1 tablet by mouth daily.    Historical Provider, MD    Family History Family History  Problem Relation Age of Onset  . Hypertension Mother   . Cancer Father     non hodgkins lymphoma  . Stroke Paternal Aunt   . Heart attack  Maternal Grandfather   . Stroke Paternal Grandmother     Social History Social History  Substance Use Topics  . Smoking status: Never Smoker  . Smokeless tobacco: Never Used  . Alcohol use Yes     Allergies   Belviq [lorcaserin hcl] and Sulfonamide derivatives   Review of Systems Review of Systems + sore throat + cough No pleuritic pain No wheezing + nasal congestion + post-nasal drainage + sinus pain/pressure No itchy/red eyes No earache No hemoptysis No SOB No fever, + chills No nausea No vomiting No abdominal pain No diarrhea No urinary symptoms No skin rash + fatigue No myalgias No headache Used OTC meds without relief   Physical Exam Triage Vital Signs ED Triage Vitals  Enc Vitals Group     BP 09/30/16 0855 134/90     Pulse Rate 09/30/16 0855 76     Resp 09/30/16 0855 16     Temp 09/30/16 0855 97.9 F (36.6 C)     Temp Source 09/30/16 0855 Oral     SpO2 09/30/16 0855 97 %     Weight 09/30/16 0855 210 lb (95.3 kg)     Height --      Head Circumference --      Peak Flow --      Pain Score 09/30/16 0856  5     Pain Loc --      Pain Edu? --      Excl. in GC? --    No data found.   Updated Vital Signs BP 134/90 (BP Location: Left Arm)   Pulse 76   Temp 97.9 F (36.6 C) (Oral)   Resp 16   Wt 210 lb (95.3 kg)   SpO2 97%   BMI 33.89 kg/m   Visual Acuity Right Eye Distance:   Left Eye Distance:   Bilateral Distance:    Right Eye Near:   Left Eye Near:    Bilateral Near:     Physical Exam Nursing notes and Vital Signs reviewed. Appearance:  Patient appears stated age, and in no acute distress Eyes:  Pupils are equal, round, and reactive to light and accomodation.  Extraocular movement is intact.  Conjunctivae are not inflamed  Ears:  Canals normal.  Tympanic membranes normal.  Nose:  Mildly congested turbinates.  No sinus tenderness.    Pharynx:  Normal Neck:  Supple.  Tender enlarged posterior/lateral nodes are palpated  bilaterally  Lungs:  Clear to auscultation.  Breath sounds are equal.  Moving air well. Heart:  Regular rate and rhythm without murmurs, rubs, or gallops.  Abdomen:  Nontender without masses or hepatosplenomegaly.  Bowel sounds are present.  No CVA or flank tenderness.  Extremities:  No edema.  Skin:  No rash present.    UC Treatments / Results  Labs (all labs ordered are listed, but only abnormal results are displayed) Labs Reviewed - No data to display  EKG  EKG Interpretation None       Radiology No results found.  Procedures Procedures (including critical care time)  Medications Ordered in UC Medications - No data to display   Initial Impression / Assessment and Plan / UC Course  I have reviewed the triage vital signs and the nursing notes.  Pertinent labs & imaging results that were available during my care of the patient were reviewed by me and considered in my medical decision making (see chart for details).  Clinical Course   There is no evidence of bacterial infection today.   Treat symptomatically for now  Prescription written for Benzonatate (Tessalon) to take at bedtime for night-time cough.  Take plain guaifenesin (1200mg  extended release tabs such as Mucinex) twice daily, with plenty of water, for cough and congestion.  May add Pseudoephedrine (30mg , one or two every 4 to 6 hours) for sinus congestion.  Get adequate rest.   May use Afrin nasal spray (or generic oxymetazoline) twice daily for about 5 days and then discontinue.  Also recommend using saline nasal spray several times daily and saline nasal irrigation (AYR is a common brand).  Use Flonase nasal spray each morning after using Afrin nasal spray and saline nasal irrigation. Try warm salt water gargles for sore throat.  Stop all antihistamines for now, and other non-prescription cough/cold preparations. Begin Azithromycin if not improving about one week or if persistent fever develops (Given a  prescription to hold, with an expiration date)  Follow-up with family doctor if not improving about10 days.      Final Clinical Impressions(s) / UC Diagnoses   Final diagnoses:  Viral URI with cough    New Prescriptions New Prescriptions   AZITHROMYCIN (ZITHROMAX Z-PAK) 250 MG TABLET    Take 2 tabs today; then begin one tab once daily for 4 more days. (Rx void after 10/07/16)   BENZONATATE (TESSALON) 200 MG CAPSULE  Take one cap by mouth at bedtime as needed for cough.  May repeat in 4 to 6 hours     Lattie Haw, MD 10/13/16 [phone]

## 2016-09-30 NOTE — Telephone Encounter (Signed)
Patient reports she did not receive an Rx for preventative yeast infection as she always gets one with antibiotic use.

## 2016-11-20 ENCOUNTER — Encounter: Payer: Self-pay | Admitting: Physician Assistant

## 2016-11-20 ENCOUNTER — Ambulatory Visit (INDEPENDENT_AMBULATORY_CARE_PROVIDER_SITE_OTHER): Payer: BLUE CROSS/BLUE SHIELD | Admitting: Physician Assistant

## 2016-11-20 ENCOUNTER — Other Ambulatory Visit: Payer: Self-pay | Admitting: Physician Assistant

## 2016-11-20 VITALS — BP 125/83 | HR 73 | Ht 66.0 in | Wt 207.0 lb

## 2016-11-20 DIAGNOSIS — K5900 Constipation, unspecified: Secondary | ICD-10-CM | POA: Diagnosis not present

## 2016-11-20 DIAGNOSIS — Z13220 Encounter for screening for lipoid disorders: Secondary | ICD-10-CM | POA: Diagnosis not present

## 2016-11-20 DIAGNOSIS — Z6833 Body mass index (BMI) 33.0-33.9, adult: Secondary | ICD-10-CM

## 2016-11-20 DIAGNOSIS — E661 Drug-induced obesity: Secondary | ICD-10-CM | POA: Diagnosis not present

## 2016-11-20 DIAGNOSIS — Z0000 Encounter for general adult medical examination without abnormal findings: Secondary | ICD-10-CM

## 2016-11-20 DIAGNOSIS — Z131 Encounter for screening for diabetes mellitus: Secondary | ICD-10-CM | POA: Diagnosis not present

## 2016-11-20 DIAGNOSIS — B351 Tinea unguium: Secondary | ICD-10-CM | POA: Diagnosis not present

## 2016-11-20 LAB — LIPID PANEL
Cholesterol: 197 mg/dL (ref <200)
HDL: 56 mg/dL (ref >50)
LDL Cholesterol: 127 mg/dL — ABNORMAL HIGH (ref <100)
Total CHOL/HDL Ratio: 3.5 Ratio (ref <5.0)
Triglycerides: 72 mg/dL (ref <150)
VLDL: 14 mg/dL (ref <30)

## 2016-11-20 LAB — COMPLETE METABOLIC PANEL WITH GFR
ALT: 18 U/L (ref 6–29)
AST: 21 U/L (ref 10–30)
Albumin: 4.3 g/dL (ref 3.6–5.1)
Alkaline Phosphatase: 44 U/L (ref 33–115)
BUN: 13 mg/dL (ref 7–25)
CO2: 27 mmol/L (ref 20–31)
Calcium: 9 mg/dL (ref 8.6–10.2)
Chloride: 107 mmol/L (ref 98–110)
Creat: 0.81 mg/dL (ref 0.50–1.10)
GFR, Est African American: >89 mL/min (ref >=60)
GFR, Est Non African American: >89 mL/min (ref >=60)
Glucose, Bld: 90 mg/dL (ref 65–99)
Potassium: 4.1 mmol/L (ref 3.5–5.3)
Sodium: 141 mmol/L (ref 135–146)
Total Bilirubin: 0.4 mg/dL (ref 0.2–1.2)
Total Protein: 6.7 g/dL (ref 6.1–8.1)

## 2016-11-20 LAB — TSH: TSH: 0.95 mIU/L

## 2016-11-20 MED ORDER — PHENTERMINE HCL 37.5 MG PO TABS: 37.5000 mg | ORAL_TABLET | Freq: Every day | ORAL | 0 refills | Status: DC

## 2016-11-20 MED ORDER — CICLOPIROX 8 % EX SOLN: Freq: Every day | CUTANEOUS | 2 refills | Status: DC

## 2016-11-20 NOTE — Progress Notes (Signed)
   Subjective:    Patient ID: Sandy Rodriguez, female    DOB: 01/29/1976, 41 y.o.   MRN: [id number]  HPI Pt is a 41 yo female who presents to the clinic to discuss weight. BMI is 33. She goes to the gym 5 times a week. She finds her appetite is just out of control during the day. She tried belviq in the past and no benefit.   She has a yellow, thick left great toenail for last 3 months. She would like treatment.   Review of Systems  All other systems reviewed and are negative.      Objective:   Physical Exam  Constitutional: She is oriented to person, place, and time. She appears well-developed and well-nourished.  HENT:  Head: Normocephalic and atraumatic.  Cardiovascular: Normal rate, regular rhythm and normal heart sounds.   Pulmonary/Chest: Effort normal and breath sounds normal.  Neurological: She is alert and oriented to person, place, and time.  Skin:  Left great toenail thick and yellow.   Psychiatric: She has a normal mood and affect. Her behavior is normal.          Assessment & Plan:  Marland Kitchen.Marland Kitchen.[name] was seen today for annual exam.  Diagnoses and all orders for this visit:  Class 1 drug-induced obesity without serious comorbidity with body mass index (BMI) of 33.0 to 33.9 in adult -     phentermine (ADIPEX-P) 37.5 MG tablet; Take 1 tablet (37.5 mg total) by mouth daily before breakfast. -     TSH  Constipation, unspecified constipation type  Screening for diabetes mellitus -     COMPLETE METABOLIC PANEL WITH GFR  Screening for lipid disorders -     Lipid panel  Preventative health care -     Lipid panel -     COMPLETE METABOLIC PANEL WITH GFR -     TSH  Onychomycosis -     ciclopirox (PENLAC) 8 % solution; Apply topically at bedtime. Apply over nail and surrounding skin. Apply daily over previous coat. After seven (7) days, may remove with alcohol and continue cycle.   HO for toenail fungus.   Pt failed belviq. Long term weight loss medications were  discussed. Pt has not tried phentermine. She would like to try it. She is aware this is a short term option. Side effects were discussed.  1500 calorie diet and 150 minutes of exercise discussed.  Follow up in 1 month.    Spent 30 minutes with patient and 50 percent of visit spent counseling patient on weight.

## 2016-11-20 NOTE — Patient Instructions (Signed)
Fungal Nail Infection Introduction Fungal nail infection is a common fungal infection of the toenails or fingernails. This condition affects toenails more often than fingernails. More than one nail may be infected. The condition can be passed from person to person (is contagious). What are the causes? This condition is caused by a fungus. Several types of funguses can cause the infection. These funguses are common in moist and warm areas. If your hands or feet come into contact with the fungus, it may get into a crack in your fingernail or toenail and cause the infection. What increases the risk? The following factors may make you more likely to develop this condition:  Being female.  Having diabetes.  Being of older age.  Living with someone who has the fungus.  Walking barefoot in areas where the fungus thrives, such as showers or locker rooms.  Having poor circulation.  Wearing shoes and socks that cause your feet to sweat.  Having athlete's foot.  Having a nail injury or history of a recent nail surgery.  Having psoriasis.  Having a weak body defense system (immune system). What are the signs or symptoms? Symptoms of this condition include:  A pale spot on the nail.  Thickening of the nail.  A nail that becomes yellow or brown.  A brittle or ragged nail edge.  A crumbling nail.  A nail that has lifted away from the nail bed. How is this diagnosed? This condition is diagnosed with a physical exam. Your health care provider may take a scraping or clipping from your nail to test for the fungus. How is this treated? Mild infections do not need treatment. If you have significant nail changes, treatment may include:  Oral antifungal medicines. You may need to take the medicine for several weeks or several months, and you may not see the results for a long time. These medicines can cause side effects. Ask your health care provider what problems to watch for.  Antifungal  nail polish and nail cream. These may be used along with oral antifungal medicines.  Laser treatment of the nail.  Surgery to remove the nail. This may be needed for the most severe infections. Treatment takes a long time, and the infection may come back. Follow these instructions at home: Medicines  Take or apply over-the-counter and prescription medicines only as told by your health care provider.  Ask your health care provider about using over-the-counter mentholated ointment on your nails. Lifestyle  Do not share personal items, such as towels or nail clippers.  Trim your nails often.  Wash and dry your hands and feet every day.  Wear absorbent socks, and change your socks frequently.  Wear shoes that allow air to circulate, such as sandals or canvas tennis shoes. Throw out old shoes.  Wear rubber gloves if you are working with your hands in wet areas.  Do not walk barefoot in shower rooms or locker rooms.  Do not use a nail salon that does not use clean instruments.  Do not use artificial nails. General instructions  Keep all follow-up visits as told by your health care provider. This is important.  Use antifungal foot powder on your feet and in your shoes. Contact a health care provider if: Your infection is not getting better or it is getting worse after several months. This information is not intended to replace advice given to you by your health care provider. Make sure you discuss any questions you have with your health care provider. Document   Released: 09/13/2000 Document Revised: 02/22/2016 Document Reviewed: 03/20/2015  2017 Elsevier  

## 2016-12-18 ENCOUNTER — Ambulatory Visit (INDEPENDENT_AMBULATORY_CARE_PROVIDER_SITE_OTHER): Payer: BLUE CROSS/BLUE SHIELD | Admitting: Physician Assistant

## 2016-12-18 ENCOUNTER — Encounter: Payer: Self-pay | Admitting: Physician Assistant

## 2016-12-18 DIAGNOSIS — E669 Obesity, unspecified: Secondary | ICD-10-CM

## 2016-12-18 MED ORDER — PHENTERMINE HCL 37.5 MG PO TABS
37.5000 mg | ORAL_TABLET | Freq: Every day | ORAL | 0 refills | Status: DC
Start: 2016-12-18 — End: 2018-06-10

## 2016-12-18 NOTE — Progress Notes (Signed)
   Subjective:    Patient ID: Sandy Rodriguez, female    DOB: 07/12/1976, 41 y.o.   MRN: [id number]  HPI Pt is a 41 yo female who presents to the clinic for 1 month weight check on phentermine. MWF cardio in am. TTH weight lifting. No palpitations, constipation exacerbation, insomnia, mood changes. She has lost 6lbs.   LBS lizzy herb shopt   Review of Systems  All other systems reviewed and are negative.      Objective:   Physical Exam  Constitutional: She is oriented to person, place, and time. She appears well-developed and well-nourished.  HENT:  Head: Normocephalic and atraumatic.  Cardiovascular: Normal rate, regular rhythm and normal heart sounds.   Pulmonary/Chest: Effort normal and breath sounds normal.  Neurological: She is alert and oriented to person, place, and time.  Psychiatric: She has a normal mood and affect. Her behavior is normal.          Assessment & Plan:   Marland Kitchen.Marland Kitchen.Sandy Rodriguez was seen today for obesity.  Diagnoses and all orders for this visit:  Obesity (BMI 30.0-34.9) -     phentermine (ADIPEX-P) 37.5 MG tablet; Take 1 tablet (37.5 mg total) by mouth daily before breakfast.   .. [facility]/18[facility]2016  Decreased Interest 0  Down, Depressed, Hopeless 0  PHQ - 2 Score 0   Follow up nurse visit in 2 months. Patient is doing great. Lost weight and no side effects.continue exercise and diet changes.  Marland Kitchen..Marland Kitchen

## 2017-02-17 ENCOUNTER — Ambulatory Visit: Payer: BLUE CROSS/BLUE SHIELD

## 2017-10-07 ENCOUNTER — Encounter: Payer: Self-pay | Admitting: Physician Assistant

## 2017-10-07 ENCOUNTER — Ambulatory Visit (INDEPENDENT_AMBULATORY_CARE_PROVIDER_SITE_OTHER): Payer: BLUE CROSS/BLUE SHIELD | Admitting: Physician Assistant

## 2017-10-07 VITALS — BP 139/84 | HR 97 | Temp 98.9°F | Ht 66.0 in | Wt 211.0 lb

## 2017-10-07 DIAGNOSIS — J020 Streptococcal pharyngitis: Secondary | ICD-10-CM

## 2017-10-07 LAB — POCT RAPID STREP A (OFFICE): Rapid Strep A Screen: POSITIVE — AB

## 2017-10-07 MED ORDER — PENICILLIN G BENZATHINE 1200000 UNIT/2ML IM SUSP
1.2000 10*6.[IU] | Freq: Once | INTRAMUSCULAR | Status: AC
Start: 2017-10-07 — End: 2017-10-07
  Administered 2017-10-07: 1.2 10*6.[IU] via INTRAMUSCULAR

## 2017-10-07 NOTE — Progress Notes (Addendum)
    Subjective:    Patient ID: Sandy Rodriguez, female    DOB: 03/02/1976, 42 y.o.   MRN: [id number]  HPI Pt is a 42 year old female presenting with worsening sore throat. Symptoms began Sunday morning (10/05/17) , and have gradually worsened. She reports feeling that the back of her mouth feels raw and experiences pain with swallowing that is described as feeling like "swallowing sandpaper". She says due to these symptoms she has been eating less. She has had minimal relief with warm liquids like soup and tea and minimal pain relief with the tylenol she is taking for pain. She also describes her neck as sore. She reports a fever of 100 degrees farenheit this morning and took tylenol in response. No other current sick contacts. Her daughter had strep 3 weeks ago.   .. Active Ambulatory Problems    Diagnosis Date Noted  . SKIN TAG 04/09/2010  . Obesity, unspecified 06/22/2014  . Abnormal weight gain 06/22/2014  . CN (constipation) 06/22/2014  . Allergic sinusitis 06/14/2015  . Obesity 06/14/2015  . Onychomycosis 11/20/2016   Resolved Ambulatory Problems    Diagnosis Date Noted  . No Resolved Ambulatory Problems   No Additional Past Medical History       Review of Systems  Constitutional: Positive for fever. Negative for chills and fatigue.  HENT: Positive for sore throat. Negative for congestion, ear pain and sinus pressure.   Respiratory: Negative for cough.        Objective:   Physical Exam  Constitutional: She is oriented to person, place, and time. She appears well-developed and well-nourished. No distress.  HENT:  Head: Normocephalic and atraumatic.  Right Ear: External ear normal.  Left Ear: External ear normal.  Erythematous posterior pharynx.   Neck: Neck supple.  Cardiovascular: Normal rate, regular rhythm and normal heart sounds. Exam reveals no gallop and no friction rub.  No murmur heard. Pulmonary/Chest: Effort normal and breath sounds normal. She has no wheezes.   Lymphadenopathy:    She has cervical adenopathy.  Neurological: She is alert and oriented to person, place, and time.  Psychiatric: She has a normal mood and affect. Her behavior is normal.   Positive rapid strep test.   Vitals:   10/07/17 1451  BP: 139/84  Pulse: 97  Temp: 98.9 F (37.2 C)       Assessment & Plan:  [name] was seen today for sore throat.  Diagnoses and all orders for this visit:  Streptococcal sore throat -     POCT rapid strep A -     penicillin g benzathine (BICILLIN LA) 1200000 UNIT/2ML injection 1.2 Million Units  Patient provided with handout explaining her diagnosis.She was instructed to continue proper hand hygiene, discard any toothbrushes that she has used since her illness started, and to report back to the office if symptoms do not improve. HO given.

## 2017-10-07 NOTE — Patient Instructions (Signed)

## 2017-11-06 ENCOUNTER — Ambulatory Visit (INDEPENDENT_AMBULATORY_CARE_PROVIDER_SITE_OTHER): Payer: BLUE CROSS/BLUE SHIELD

## 2017-11-06 ENCOUNTER — Encounter: Payer: Self-pay | Admitting: Sports Medicine

## 2017-11-06 ENCOUNTER — Ambulatory Visit (INDEPENDENT_AMBULATORY_CARE_PROVIDER_SITE_OTHER): Payer: BLUE CROSS/BLUE SHIELD | Admitting: Sports Medicine

## 2017-11-06 DIAGNOSIS — S86111A Strain of other muscle(s) and tendon(s) of posterior muscle group at lower leg level, right leg, initial encounter: Secondary | ICD-10-CM | POA: Insufficient documentation

## 2017-11-06 DIAGNOSIS — M25571 Pain in right ankle and joints of right foot: Secondary | ICD-10-CM | POA: Diagnosis not present

## 2017-11-06 DIAGNOSIS — M79671 Pain in right foot: Secondary | ICD-10-CM | POA: Diagnosis not present

## 2017-11-06 NOTE — Progress Notes (Signed)
Subjective:    I'm seeing this patient as a consultation for: Tandy Gaw, PA-C  CC: Right foot pain  HPI: Several days ago this pleasant 42 year old female was at the gym, she felt a pop in the back of her calf, had immediate pain, swelling, as well as pain at the base of the fifth metatarsal.  She took it easy for the next few days but continued to have pain and is here for further evaluation and definitive treatment.  Symptoms are mild, persistent.  Localized without radiation.  I reviewed the past medical history, family history, social history, surgical history, and allergies today and no changes were needed.  Please see the problem list section below in epic for further details.  Past Medical History: No past medical history on file. Past Surgical History: Past Surgical History:  Procedure Laterality Date  . BREAST REDUCTION SURGERY  1998  . CHOLECYSTECTOMY     Social History: Social History   Socioeconomic History  . Marital status: Married    Spouse name: None  . Number of children: None  . Years of education: None  . Highest education level: None  Social Needs  . Financial resource strain: None  . Food insecurity - worry: None  . Food insecurity - inability: None  . Transportation needs - medical: None  . Transportation needs - non-medical: None  Occupational History  . None  Tobacco Use  . Smoking status: Never Smoker  . Smokeless tobacco: Never Used  Substance and Sexual Activity  . Alcohol use: Yes  . Drug use: No  . Sexual activity: Yes  Other Topics Concern  . None  Social History Narrative  . None   Family History: Family History  Problem Relation Age of Onset  . Hypertension Mother   . Cancer Father        non hodgkins lymphoma  . Stroke Paternal Aunt   . Heart attack Maternal Grandfather   . Stroke Paternal Grandmother    Allergies: Allergies  Allergen Reactions  . Belviq [Lorcaserin Hcl]     Makes her feel weird.   . Sulfonamide  Derivatives    Medications: See med rec.  Review of Systems: No headache, visual changes, nausea, vomiting, diarrhea, constipation, dizziness, abdominal pain, skin rash, fevers, chills, night sweats, weight loss, swollen lymph nodes, body aches, joint swelling, muscle aches, chest pain, shortness of breath, mood changes, visual or auditory hallucinations.   Objective:   General: Well Developed, well nourished, and in no acute distress.  Neuro:  Extra-ocular muscles intact, able to move all 4 extremities, sensation grossly intact.  Deep tendon reflexes tested were normal. Psych: Alert and oriented, mood congruent with affect. ENT:  Ears and nose appear unremarkable.  Hearing grossly normal. Neck: Unremarkable overall appearance, trachea midline.  No visible thyroid enlargement. Eyes: Conjunctivae and lids appear unremarkable.  Pupils equal and round. Skin: Warm and dry, no rashes noted.  Cardiovascular: Pulses palpable, no extremity edema. Right ankle: No visible erythema or swelling. Range of motion is full in all directions. Strength is 5/5 in all directions. Stable lateral and medial ligaments; squeeze test and kleiger test unremarkable; Talar dome nontender; No pain at base of 5th MT; No tenderness over cuboid; No tenderness over N spot or navicular prominence No tenderness on posterior aspects of lateral and medial malleolus No sign of peroneal tendon subluxations; Negative tarsal tunnel tinel's Minimal tenderness at the musculotendinous junction of the gastrocnemius, negative Thompson's test. She also has a bit of pain  at the base of the fifth metatarsal, no swelling, no bruising.  Impression and Recommendations:   This case required medical decision making of moderate complexity.  Gastrocnemius muscle strain, right, initial encounter At the musculotendinous junction. Heel lifts, rehab exercises given, strap with compressive dressing. If there is some pain at the base of the  fifth metatarsal, considering the pop and tenderness here we are going to go ahead and get a screening x-ray here. Return if no better in 2 weeks. ___________________________________________ Ihor Austinhomas J. Benjamin Stainhekkekandam, M.D., ABFM., CAQSM. Primary Care and Sports Medicine [facility]  Adjunct Instructor of Family Medicine  [employer]

## 2017-11-06 NOTE — Assessment & Plan Note (Signed)
At the musculotendinous junction. Heel lifts, rehab exercises given, strap with compressive dressing. If there is some pain at the base of the fifth metatarsal, considering the pop and tenderness here we are going to go ahead and get a screening x-ray here. Return if no better in 2 weeks.

## 2017-12-12 ENCOUNTER — Encounter: Payer: Self-pay | Admitting: Sports Medicine

## 2017-12-12 ENCOUNTER — Ambulatory Visit (INDEPENDENT_AMBULATORY_CARE_PROVIDER_SITE_OTHER): Payer: BLUE CROSS/BLUE SHIELD | Admitting: Sports Medicine

## 2017-12-12 DIAGNOSIS — M79671 Pain in right foot: Secondary | ICD-10-CM | POA: Diagnosis not present

## 2017-12-12 DIAGNOSIS — S86111A Strain of other muscle(s) and tendon(s) of posterior muscle group at lower leg level, right leg, initial encounter: Secondary | ICD-10-CM | POA: Diagnosis not present

## 2017-12-12 DIAGNOSIS — M7751 Other enthesopathy of right foot: Secondary | ICD-10-CM | POA: Diagnosis not present

## 2017-12-12 DIAGNOSIS — M25572 Pain in left ankle and joints of left foot: Secondary | ICD-10-CM | POA: Insufficient documentation

## 2017-12-12 MED ORDER — MELOXICAM 15 MG PO TABS: ORAL_TABLET | ORAL | 3 refills | Status: DC

## 2017-12-12 NOTE — Assessment & Plan Note (Signed)
Resolved

## 2017-12-12 NOTE — Progress Notes (Signed)
Subjective:    I'm seeing this patient as a consultation for: Tandy Gaw, PA-C  CC: Multiple issues  HPI: Right foot pain: Localized at the base of the fifth metatarsal, present for greater than 6 weeks, still painful, negative x-rays.  Gastroc strain: Resolved.  Right heel pain: Fairly new, present at the Achilles insertion, but somewhat anterior on the foot near the retrocalcaneal bursa, moderate, persistent without radiation.  Left foot pain: Localized behind the medial malleolus, worse with weightbearing, exercise.  I reviewed the past medical history, family history, social history, surgical history, and allergies today and no changes were needed.  Please see the problem list section below in epic for further details.  Past Medical History: No past medical history on file. Past Surgical History: Past Surgical History:  Procedure Laterality Date  . BREAST REDUCTION SURGERY  1998  . CHOLECYSTECTOMY     Social History: Social History   Socioeconomic History  . Marital status: Married    Spouse name: None  . Number of children: None  . Years of education: None  . Highest education level: None  Social Needs  . Financial resource strain: None  . Food insecurity - worry: None  . Food insecurity - inability: None  . Transportation needs - medical: None  . Transportation needs - non-medical: None  Occupational History  . None  Tobacco Use  . Smoking status: Never Smoker  . Smokeless tobacco: Never Used  Substance and Sexual Activity  . Alcohol use: Yes  . Drug use: No  . Sexual activity: Yes  Other Topics Concern  . None  Social History Narrative  . None   Family History: Family History  Problem Relation Age of Onset  . Hypertension Mother   . Cancer Father        non hodgkins lymphoma  . Stroke Paternal Aunt   . Heart attack Maternal Grandfather   . Stroke Paternal Grandmother    Allergies: Allergies  Allergen Reactions  . Belviq [Lorcaserin Hcl]      Makes her feel weird.   . Sulfonamide Derivatives    Medications: See med rec.  Review of Systems: No headache, visual changes, nausea, vomiting, diarrhea, constipation, dizziness, abdominal pain, skin rash, fevers, chills, night sweats, weight loss, swollen lymph nodes, body aches, joint swelling, muscle aches, chest pain, shortness of breath, mood changes, visual or auditory hallucinations.   Objective:   General: Well Developed, well nourished, and in no acute distress.  Neuro:  Extra-ocular muscles intact, able to move all 4 extremities, sensation grossly intact.  Deep tendon reflexes tested were normal. Psych: Alert and oriented, mood congruent with affect. ENT:  Ears and nose appear unremarkable.  Hearing grossly normal. Neck: Unremarkable overall appearance, trachea midline.  No visible thyroid enlargement. Eyes: Conjunctivae and lids appear unremarkable.  Pupils equal and round. Skin: Warm and dry, no rashes noted.  Cardiovascular: Pulses palpable, no extremity edema. Right foot: No visible erythema or swelling. Range of motion is full in all directions. Strength is 5/5 in all directions. No hallux valgus. No pes cavus or pes planus. No abnormal callus noted. No pain over the navicular prominence, or base of fifth metatarsal. No tenderness to palpation of the calcaneal insertion of plantar fascia. No pain at the Achilles insertion. No pain over the calcaneal bursa. No pain of the retrocalcaneal bursa. Tender to palpation behind the fifth metatarsal No hallux rigidus or limitus. No tenderness palpation over interphalangeal joints. No pain with compression of the metatarsal heads.  Neurovascularly intact distally. Left ankle: No visible erythema or swelling. Range of motion is full in all directions. Strength is 5/5 in all directions. Stable lateral and medial ligaments; squeeze test and kleiger test unremarkable; Talar dome nontender; No pain at base of 5th MT; No  tenderness over cuboid; No tenderness over N spot or navicular prominence Tender to palpation behind the medial malleolus with reproduction of pain with resisted flexion of the great toe, small toes and inversion of the ankle No sign of peroneal tendon subluxations; Negative tarsal tunnel tinel's Able to walk 4 steps.  Impression and Recommendations:   This case required medical decision making of moderate complexity.  Retrocalcaneal bursitis, right Bilateral heel lift. Meloxicam. Return in a month for this, if persistently painful we will do a retrocalcaneal bursa injection  Pain at the base of the right fifth metatarsal Pain at the base of the right fifth metatarsal, present now for over 6 weeks. X-rays unremarkable. Adding an MRI.  Gastrocnemius muscle strain, right, initial encounter Resolved.  Ankle pain, left A combination of tibialis posterior, flexor hallucis longus, flexor digitorum longus tenosynovitis. Meloxicam, return for custom orthotics. ___________________________________________ Ihor Austinhomas J. Benjamin Stainhekkekandam, M.D., ABFM., CAQSM. Primary Care and Sports Medicine [facility]  Adjunct Instructor of Family Medicine  [employer]

## 2017-12-12 NOTE — Assessment & Plan Note (Signed)
Bilateral heel lift. Meloxicam. Return in a month for this, if persistently painful we will do a retrocalcaneal bursa injection

## 2017-12-12 NOTE — Assessment & Plan Note (Signed)
Pain at the base of the right fifth metatarsal, present now for over 6 weeks. X-rays unremarkable. Adding an MRI.

## 2017-12-12 NOTE — Assessment & Plan Note (Signed)
A combination of tibialis posterior, flexor hallucis longus, flexor digitorum longus tenosynovitis. Meloxicam, return for custom orthotics.

## 2017-12-19 ENCOUNTER — Encounter: Payer: Self-pay | Admitting: Sports Medicine

## 2017-12-19 ENCOUNTER — Ambulatory Visit (INDEPENDENT_AMBULATORY_CARE_PROVIDER_SITE_OTHER): Payer: BLUE CROSS/BLUE SHIELD | Admitting: Sports Medicine

## 2017-12-19 DIAGNOSIS — M25572 Pain in left ankle and joints of left foot: Secondary | ICD-10-CM | POA: Diagnosis not present

## 2017-12-19 NOTE — Assessment & Plan Note (Signed)
Persistent posterior tibialis, flexor hallucis longus, flexor digitorum longus referred pain. Custom orthotics as above.

## 2017-12-19 NOTE — Progress Notes (Signed)
    Patient was fitted for a : standard, cushioned, semi-rigid orthotic. The orthotic was heated and afterward the patient stood on the orthotic blank positioned on the orthotic stand. The patient was positioned in subtalar neutral position and 10 degrees of ankle dorsiflexion in a weight bearing stance. After completion of molding, a stable base was applied to the orthotic blank. The blank was ground to a stable position for weight bearing. Size: 10 Base: White EVA Additional Posting and Padding: None The patient ambulated these, and they were very comfortable.  I spent 40 minutes with this patient, greater than 50% was face-to-face time counseling regarding the below diagnosis.  ___________________________________________ Thomas J. Thekkekandam, M.D., ABFM., CAQSM. Primary Care and Sports Medicine [facility]  Adjunct Instructor of Family Medicine  [employer]   

## 2018-01-06 DIAGNOSIS — Z01419 Encounter for gynecological examination (general) (routine) without abnormal findings: Secondary | ICD-10-CM | POA: Diagnosis not present

## 2018-01-06 DIAGNOSIS — Z6834 Body mass index (BMI) 34.0-34.9, adult: Secondary | ICD-10-CM | POA: Diagnosis not present

## 2018-01-06 LAB — RESULTS CONSOLE HPV: CHL HPV: NEGATIVE

## 2018-01-06 LAB — HM PAP SMEAR

## 2018-01-19 ENCOUNTER — Ambulatory Visit (INDEPENDENT_AMBULATORY_CARE_PROVIDER_SITE_OTHER): Payer: BLUE CROSS/BLUE SHIELD

## 2018-01-19 ENCOUNTER — Ambulatory Visit: Payer: BLUE CROSS/BLUE SHIELD | Admitting: Sports Medicine

## 2018-01-19 DIAGNOSIS — M7088 Other soft tissue disorders related to use, overuse and pressure other site: Secondary | ICD-10-CM | POA: Diagnosis not present

## 2018-01-19 DIAGNOSIS — M79671 Pain in right foot: Secondary | ICD-10-CM

## 2018-01-19 DIAGNOSIS — R600 Localized edema: Secondary | ICD-10-CM | POA: Diagnosis not present

## 2018-01-19 DIAGNOSIS — M7661 Achilles tendinitis, right leg: Secondary | ICD-10-CM | POA: Diagnosis not present

## 2018-01-23 ENCOUNTER — Ambulatory Visit (INDEPENDENT_AMBULATORY_CARE_PROVIDER_SITE_OTHER): Payer: BLUE CROSS/BLUE SHIELD | Admitting: Sports Medicine

## 2018-01-23 ENCOUNTER — Encounter: Payer: Self-pay | Admitting: Sports Medicine

## 2018-01-23 DIAGNOSIS — M79671 Pain in right foot: Secondary | ICD-10-CM | POA: Diagnosis not present

## 2018-01-23 DIAGNOSIS — M25572 Pain in left ankle and joints of left foot: Secondary | ICD-10-CM | POA: Diagnosis not present

## 2018-01-23 DIAGNOSIS — M7751 Other enthesopathy of right foot: Secondary | ICD-10-CM

## 2018-01-23 NOTE — Assessment & Plan Note (Signed)
Resolved with custom orthotics, she did have some posterior tibialis, flexor hallucis longus and flexor digitorum pain.

## 2018-01-23 NOTE — Progress Notes (Signed)
Subjective:    CC: Follow-up  HPI: Sandy Rodriguez is a pleasant 42 year old female, we have been treating her for bilateral foot and ankle pain, she had several pathologies including left tibialis posterior, flexor hallucis longus, flexor digitorum longus tenosynovitis, this resolved with custom orthotics, she also had some right posterior heel pain that failed to respond to greater than 6 weeks of conservative measures.  We obtained an MRI that showed a retrocalcaneal bursitis and insertional Achilles tendinosis.  At this point she is agreeable to proceed with interventional treatment.  I reviewed the past medical history, family history, social history, surgical history, and allergies today and no changes were needed.  Please see the problem list section below in epic for further details.  Past Medical History: No past medical history on file. Past Surgical History: Past Surgical History:  Procedure Laterality Date  . BREAST REDUCTION SURGERY  1998  . CHOLECYSTECTOMY     Social History: Social History   Socioeconomic History  . Marital status: Married    Spouse name: Not on file  . Number of children: Not on file  . Years of education: Not on file  . Highest education level: Not on file  Occupational History  . Not on file  Social Needs  . Financial resource strain: Not on file  . Food insecurity:    Worry: Not on file    Inability: Not on file  . Transportation needs:    Medical: Not on file    Non-medical: Not on file  Tobacco Use  . Smoking status: Never Smoker  . Smokeless tobacco: Never Used  Substance and Sexual Activity  . Alcohol use: Yes  . Drug use: No  . Sexual activity: Yes  Lifestyle  . Physical activity:    Days per week: Not on file    Minutes per session: Not on file  . Stress: Not on file  Relationships  . Social connections:    Talks on phone: Not on file    Gets together: Not on file    Attends religious service: Not on file    Active member of club  or organization: Not on file    Attends meetings of clubs or organizations: Not on file    Relationship status: Not on file  Other Topics Concern  . Not on file  Social History Narrative  . Not on file   Family History: Family History  Problem Relation Age of Onset  . Hypertension Mother   . Cancer Father        non hodgkins lymphoma  . Stroke Paternal Aunt   . Heart attack Maternal Grandfather   . Stroke Paternal Grandmother    Allergies: Allergies  Allergen Reactions  . Belviq [Lorcaserin Hcl]     Makes her feel weird.   . Sulfonamide Derivatives    Medications: See med rec.  Review of Systems: No fevers, chills, night sweats, weight loss, chest pain, or shortness of breath.   Objective:    General: Well Developed, well nourished, and in no acute distress.  Neuro: Alert and oriented x3, extra-ocular muscles intact, sensation grossly intact.  HEENT: Normocephalic, atraumatic, pupils equal round reactive to light, neck supple, no masses, no lymphadenopathy, thyroid nonpalpable.  Skin: Warm and dry, no rashes. Cardiac: Regular rate and rhythm, no murmurs rubs or gallops, no lower extremity edema.  Respiratory: Clear to auscultation bilaterally. Not using accessory muscles, speaking in full sentences.  Procedure: Real-time Ultrasound Guided Injection of right retrocalcaneal bursa Device: GE Logiq  E  Verbal informed consent obtained.  Patient informed about risk of Achilles rupture Time-out conducted.  Noted no overlying erythema, induration, or other signs of local infection.  Skin prepped in a sterile fashion.  Local anesthesia: Topical Ethyl chloride.  With sterile technique and under real time ultrasound guidance: Using a short axis approach I advanced into the retrocalcaneal bursa, and taking care to avoid intra-Achilles injection I injected 1/2 cc kenalog 40, 1/2 cc lidocaine. Completed without difficulty  Pain immediately resolved suggesting accurate placement of  the medication.  Patient placed in a boot to protect the Achilles.  She will wear this for 1 week. Advised to call if fevers/chills, erythema, induration, drainage, or persistent bleeding.  Images permanently stored and available for review in the ultrasound unit.  Impression: Technically successful ultrasound guided injection.  Impression and Recommendations:    Ankle pain, left Resolved with custom orthotics, she did have some posterior tibialis, flexor hallucis longus and flexor digitorum pain.  Retrocalcaneal bursitis, right MRI confirmed and present now for over 6 weeks in spite of conservative measures, rehab exercises, NSAIDs, and orthotics. Retrocalcaneal bursa injection, she will need to wear a boot for 1 week after the injection to protect the Achilles. Return to see me in a month.  Pain at the base of the right fifth metatarsal Resolved with custom orthotics.  ___________________________________________ Ihor Austin. Benjamin Stain, M.D., ABFM., CAQSM. Primary Care and Sports Medicine [facility]  Adjunct Instructor of Family Medicine  [employer]

## 2018-01-23 NOTE — Assessment & Plan Note (Signed)
MRI confirmed and present now for over 6 weeks in spite of conservative measures, rehab exercises, NSAIDs, and orthotics. Retrocalcaneal bursa injection, she will need to wear a boot for 1 week after the injection to protect the Achilles. Return to see me in a month.

## 2018-01-23 NOTE — Assessment & Plan Note (Signed)
Resolved with custom orthotics 

## 2018-02-20 ENCOUNTER — Ambulatory Visit (INDEPENDENT_AMBULATORY_CARE_PROVIDER_SITE_OTHER): Payer: BLUE CROSS/BLUE SHIELD | Admitting: Sports Medicine

## 2018-02-20 DIAGNOSIS — M7751 Other enthesopathy of right foot: Secondary | ICD-10-CM | POA: Diagnosis not present

## 2018-02-20 NOTE — Progress Notes (Signed)
Subjective:    CC: Follow-up retrocorneal bursitis  HPI: Resolved after injection.  I reviewed the past medical history, family history, social history, surgical history, and allergies today and no changes were needed.  Please see the problem list section below in epic for further details.  Past Medical History: No past medical history on file. Past Surgical History: Past Surgical History:  Procedure Laterality Date  . BREAST REDUCTION SURGERY  1998  . CHOLECYSTECTOMY     Social History: Social History   Socioeconomic History  . Marital status: Married    Spouse name: Not on file  . Number of children: Not on file  . Years of education: Not on file  . Highest education level: Not on file  Occupational History  . Not on file  Social Needs  . Financial resource strain: Not on file  . Food insecurity:    Worry: Not on file    Inability: Not on file  . Transportation needs:    Medical: Not on file    Non-medical: Not on file  Tobacco Use  . Smoking status: Never Smoker  . Smokeless tobacco: Never Used  Substance and Sexual Activity  . Alcohol use: Yes  . Drug use: No  . Sexual activity: Yes  Lifestyle  . Physical activity:    Days per week: Not on file    Minutes per session: Not on file  . Stress: Not on file  Relationships  . Social connections:    Talks on phone: Not on file    Gets together: Not on file    Attends religious service: Not on file    Active member of club or organization: Not on file    Attends meetings of clubs or organizations: Not on file    Relationship status: Not on file  Other Topics Concern  . Not on file  Social History Narrative  . Not on file   Family History: Family History  Problem Relation Age of Onset  . Hypertension Mother   . Cancer Father        non hodgkins lymphoma  . Stroke Paternal Aunt   . Heart attack Maternal Grandfather   . Stroke Paternal Grandmother    Allergies: Allergies  Allergen Reactions  .  Belviq [Lorcaserin Hcl]     Makes her feel weird.   . Sulfonamide Derivatives    Medications: See med rec.  Review of Systems: No fevers, chills, night sweats, weight loss, chest pain, or shortness of breath.   Objective:    General: Well Developed, well nourished, and in no acute distress.  Neuro: Alert and oriented x3, extra-ocular muscles intact, sensation grossly intact.  HEENT: Normocephalic, atraumatic, pupils equal round reactive to light, neck supple, no masses, no lymphadenopathy, thyroid nonpalpable.  Skin: Warm and dry, no rashes. Cardiac: Regular rate and rhythm, no murmurs rubs or gallops, no lower extremity edema.  Respiratory: Clear to auscultation bilaterally. Not using accessory muscles, speaking in full sentences.  Impression and Recommendations:    Retrocalcaneal bursitis, right MRI confirmed retrocalcaneal bursitis on the right, she failed 6 weeks of conservative measures, rehab exercises NSAIDs, custom orthotics, at the last visit I did an ultrasound-guided injection taking care to avoid intra-Achilles injection and placed her in a boot for 1 week after the injection to protect the Achilles. She returns today completely pain-free. ___________________________________________ Ihor Austin. Benjamin Stain, M.D., ABFM., CAQSM. Primary Care and Sports Medicine [facility]  Adjunct Instructor of Family Medicine  [employer]  [employer]

## 2018-02-20 NOTE — Assessment & Plan Note (Signed)
MRI confirmed retrocalcaneal bursitis on the right, she failed 6 weeks of conservative measures, rehab exercises NSAIDs, custom orthotics, at the last visit I did an ultrasound-guided injection taking care to avoid intra-Achilles injection and placed her in a boot for 1 week after the injection to protect the Achilles. She returns today completely pain-free.

## 2018-06-10 ENCOUNTER — Ambulatory Visit (INDEPENDENT_AMBULATORY_CARE_PROVIDER_SITE_OTHER): Payer: BLUE CROSS/BLUE SHIELD | Admitting: Physician Assistant

## 2018-06-10 ENCOUNTER — Encounter: Payer: Self-pay | Admitting: Physician Assistant

## 2018-06-10 VITALS — BP 107/82 | HR 67 | Ht 66.0 in | Wt 210.0 lb

## 2018-06-10 DIAGNOSIS — Z13220 Encounter for screening for lipoid disorders: Secondary | ICD-10-CM | POA: Diagnosis not present

## 2018-06-10 DIAGNOSIS — B351 Tinea unguium: Secondary | ICD-10-CM

## 2018-06-10 DIAGNOSIS — Z0000 Encounter for general adult medical examination without abnormal findings: Secondary | ICD-10-CM

## 2018-06-10 DIAGNOSIS — Z6833 Body mass index (BMI) 33.0-33.9, adult: Secondary | ICD-10-CM | POA: Diagnosis not present

## 2018-06-10 DIAGNOSIS — E6609 Other obesity due to excess calories: Secondary | ICD-10-CM | POA: Diagnosis not present

## 2018-06-10 DIAGNOSIS — L918 Other hypertrophic disorders of the skin: Secondary | ICD-10-CM

## 2018-06-10 DIAGNOSIS — Z1231 Encounter for screening mammogram for malignant neoplasm of breast: Secondary | ICD-10-CM | POA: Diagnosis not present

## 2018-06-10 DIAGNOSIS — Q828 Other specified congenital malformations of skin: Secondary | ICD-10-CM | POA: Diagnosis not present

## 2018-06-10 DIAGNOSIS — Z131 Encounter for screening for diabetes mellitus: Secondary | ICD-10-CM

## 2018-06-10 LAB — CBC WITH DIFFERENTIAL/PLATELET
Basophils Absolute: 35 cells/uL (ref 0–200)
Basophils Relative: 0.3 %
Eosinophils Absolute: 176 cells/uL (ref 15–500)
Eosinophils Relative: 1.5 %
HCT: 43.5 % (ref 35.0–45.0)
Hemoglobin: 14.4 g/dL (ref 11.7–15.5)
Lymphs Abs: 2480 cells/uL (ref 850–3900)
MCH: 30 pg (ref 27.0–33.0)
MCHC: 33.1 g/dL (ref 32.0–36.0)
MCV: 90.6 fL (ref 80.0–100.0)
MPV: 9.7 fL (ref 7.5–12.5)
Monocytes Relative: 7.6 %
Neutro Abs: 8120 cells/uL — ABNORMAL HIGH (ref 1500–7800)
Neutrophils Relative %: 69.4 %
Platelets: 326 10*3/uL (ref 140–400)
RBC: 4.8 10*6/uL (ref 3.80–5.10)
RDW: 12.4 % (ref 11.0–15.0)
Total Lymphocyte: 21.2 %
WBC mixed population: 889 cells/uL (ref 200–950)
WBC: 11.7 10*3/uL — ABNORMAL HIGH (ref 3.8–10.8)

## 2018-06-10 LAB — COMPLETE METABOLIC PANEL WITH GFR
AG Ratio: 2 (calc) (ref 1.0–2.5)
ALT: 16 U/L (ref 6–29)
AST: 20 U/L (ref 10–30)
Albumin: 4.3 g/dL (ref 3.6–5.1)
Alkaline phosphatase (APISO): 47 U/L (ref 33–115)
BUN: 12 mg/dL (ref 7–25)
CO2: 29 mmol/L (ref 20–32)
Calcium: 9.4 mg/dL (ref 8.6–10.2)
Chloride: 103 mmol/L (ref 98–110)
Creat: 0.8 mg/dL (ref 0.50–1.10)
GFR, Est African American: 107 mL/min/{1.73_m2} (ref >=60)
GFR, Est Non African American: 92 mL/min/{1.73_m2} (ref >=60)
Globulin: 2.2 g/dL (calc) (ref 1.9–3.7)
Glucose, Bld: 87 mg/dL (ref 65–99)
Potassium: 4.2 mmol/L (ref 3.5–5.3)
Sodium: 137 mmol/L (ref 135–146)
Total Bilirubin: 0.5 mg/dL (ref 0.2–1.2)
Total Protein: 6.5 g/dL (ref 6.1–8.1)

## 2018-06-10 LAB — LIPID PANEL W/REFLEX DIRECT LDL
Cholesterol: 196 mg/dL (ref <200)
HDL: 51 mg/dL (ref >50)
LDL Cholesterol (Calc): 123 mg/dL (calc) — ABNORMAL HIGH
Non-HDL Cholesterol (Calc): 145 mg/dL (calc) — ABNORMAL HIGH (ref <130)
Total CHOL/HDL Ratio: 3.8 (calc) (ref <5.0)
Triglycerides: 108 mg/dL (ref <150)

## 2018-06-10 LAB — TSH: TSH: 0.81 mIU/L

## 2018-06-10 MED ORDER — TERBINAFINE HCL 250 MG PO TABS: 250.0000 mg | ORAL_TABLET | Freq: Every day | ORAL | 0 refills | Status: AC

## 2018-06-10 MED ORDER — BUPROPION HCL ER (SR) 150 MG PO TB12: 150.0000 mg | ORAL_TABLET | Freq: Two times a day (BID) | ORAL | 2 refills | Status: DC

## 2018-06-10 NOTE — Patient Instructions (Addendum)
saxenda belviq qsymia  Health Maintenance, Female Adopting a healthy lifestyle and getting preventive care can go a long way to promote health and wellness. Talk with your health care provider about what schedule of regular examinations is right for you. This is a good chance for you to check in with your provider about disease prevention and staying healthy. In between checkups, there are plenty of things you can do on your own. Experts have done a lot of research about which lifestyle changes and preventive measures are most likely to keep you healthy. Ask your health care provider for more information. Weight and diet Eat a healthy diet  Be sure to include plenty of vegetables, fruits, low-fat dairy products, and lean protein.  Do not eat a lot of foods high in solid fats, added sugars, or salt.  Get regular exercise. This is one of the most important things you can do for your health. ? Most adults should exercise for at least 150 minutes each week. The exercise should increase your heart rate and make you sweat (moderate-intensity exercise). ? Most adults should also do strengthening exercises at least twice a week. This is in addition to the moderate-intensity exercise.  Maintain a healthy weight  Body mass index (BMI) is a measurement that can be used to identify possible weight problems. It estimates body fat based on height and weight. Your health care provider can help determine your BMI and help you achieve or maintain a healthy weight.  For females 57 years of age and older: ? A BMI below 18.5 is considered underweight. ? A BMI of 18.5 to 24.9 is normal. ? A BMI of 25 to 29.9 is considered overweight. ? A BMI of 30 and above is considered obese.  Watch levels of cholesterol and blood lipids  You should start having your blood tested for lipids and cholesterol at 40 years of age, then have this test every 5 years.  You may need to have your cholesterol levels checked more  often if: ? Your lipid or cholesterol levels are high. ? You are older than 40 years of age. ? You are at high risk for heart disease.  Cancer screening Lung Cancer  Lung cancer screening is recommended for adults 36-40 years old who are at high risk for lung cancer because of a history of smoking.  A yearly low-dose CT scan of the lungs is recommended for people who: ? Currently smoke. ? Have quit within the past 15 years. ? Have at least a 30-pack-year history of smoking. A pack year is smoking an average of one pack of cigarettes a day for 1 year.  Yearly screening should continue until it has been 15 years since you quit.  Yearly screening should stop if you develop a health problem that would prevent you from having lung cancer treatment.  Breast Cancer  Practice breast self-awareness. This means understanding how your breasts normally appear and feel.  It also means doing regular breast self-exams. Let your health care provider know about any changes, no matter how small.  If you are in your 20s or 30s, you should have a clinical breast exam (CBE) by a health care provider every 1-3 years as part of a regular health exam.  If you are 8 or older, have a CBE every year. Also consider having a breast X-ray (mammogram) every year.  If you have a family history of breast cancer, talk to your health care provider about genetic screening.  If you  are at high risk for breast cancer, talk to your health care provider about having an MRI and a mammogram every year.  Breast cancer gene (BRCA) assessment is recommended for women who have family members with BRCA-related cancers. BRCA-related cancers include: ? Breast. ? Ovarian. ? Tubal. ? Peritoneal cancers.  Results of the assessment will determine the need for genetic counseling and BRCA1 and BRCA2 testing.  Cervical Cancer Your health care provider may recommend that you be screened regularly for cancer of the pelvic organs  (ovaries, uterus, and vagina). This screening involves a pelvic examination, including checking for microscopic changes to the surface of your cervix (Pap test). You may be encouraged to have this screening done every 3 years, beginning at age 49.  For women ages 74-65, health care providers may recommend pelvic exams and Pap testing every 3 years, or they may recommend the Pap and pelvic exam, combined with testing for human papilloma virus (HPV), every 5 years. Some types of HPV increase your risk of cervical cancer. Testing for HPV may also be done on women of any age with unclear Pap test results.  Other health care providers may not recommend any screening for nonpregnant women who are considered low risk for pelvic cancer and who do not have symptoms. Ask your health care provider if a screening pelvic exam is right for you.  If you have had past treatment for cervical cancer or a condition that could lead to cancer, you need Pap tests and screening for cancer for at least 20 years after your treatment. If Pap tests have been discontinued, your risk factors (such as having a new sexual partner) need to be reassessed to determine if screening should resume. Some women have medical problems that increase the chance of getting cervical cancer. In these cases, your health care provider may recommend more frequent screening and Pap tests.  Colorectal Cancer  This type of cancer can be detected and often prevented.  Routine colorectal cancer screening usually begins at 39 years of age and continues through 40 years of age.  Your health care provider may recommend screening at an earlier age if you have risk factors for colon cancer.  Your health care provider may also recommend using home test kits to check for hidden blood in the stool.  A small camera at the end of a tube can be used to examine your colon directly (sigmoidoscopy or colonoscopy). This is done to check for the earliest forms of  colorectal cancer.  Routine screening usually begins at age 54.  Direct examination of the colon should be repeated every 5-10 years through 40 years of age. However, you may need to be screened more often if early forms of precancerous polyps or small growths are found.  Skin Cancer  Check your skin from head to toe regularly.  Tell your health care provider about any new moles or changes in moles, especially if there is a change in a mole's shape or color.  Also tell your health care provider if you have a mole that is larger than the size of a pencil eraser.  Always use sunscreen. Apply sunscreen liberally and repeatedly throughout the day.  Protect yourself by wearing long sleeves, pants, a wide-brimmed hat, and sunglasses whenever you are outside.  Heart disease, diabetes, and high blood pressure  High blood pressure causes heart disease and increases the risk of stroke. High blood pressure is more likely to develop in: ? People who have blood pressure in  the high end of the normal range (130-139/85-89 mm Hg). ? People who are overweight or obese. ? People who are African American.  If you are 83-66 years of age, have your blood pressure checked every 3-5 years. If you are 32 years of age or older, have your blood pressure checked every year. You should have your blood pressure measured twice-once when you are at a hospital or clinic, and once when you are not at a hospital or clinic. Record the average of the two measurements. To check your blood pressure when you are not at a hospital or clinic, you can use: ? An automated blood pressure machine at a pharmacy. ? A home blood pressure monitor.  If you are between 16 years and 10 years old, ask your health care provider if you should take aspirin to prevent strokes.  Have regular diabetes screenings. This involves taking a blood sample to check your fasting blood sugar level. ? If you are at a normal weight and have a low risk  for diabetes, have this test once every three years after 40 years of age. ? If you are overweight and have a high risk for diabetes, consider being tested at a younger age or more often. Preventing infection Hepatitis B  If you have a higher risk for hepatitis B, you should be screened for this virus. You are considered at high risk for hepatitis B if: ? You were born in a country where hepatitis B is common. Ask your health care provider which countries are considered high risk. ? Your parents were born in a high-risk country, and you have not been immunized against hepatitis B (hepatitis B vaccine). ? You have HIV or AIDS. ? You use needles to inject street drugs. ? You live with someone who has hepatitis B. ? You have had sex with someone who has hepatitis B. ? You get hemodialysis treatment. ? You take certain medicines for conditions, including cancer, organ transplantation, and autoimmune conditions.  Hepatitis C  Blood testing is recommended for: ? Everyone born from [birth year] through 1963. ? Anyone with known risk factors for hepatitis C.  Sexually transmitted infections (STIs)  You should be screened for sexually transmitted infections (STIs) including gonorrhea and chlamydia if: ? You are sexually active and are younger than 40 years of age. ? You are older than 40 years of age and your health care provider tells you that you are at risk for this type of infection. ? Your sexual activity has changed since you were last screened and you are at an increased risk for chlamydia or gonorrhea. Ask your health care provider if you are at risk.  If you do not have HIV, but are at risk, it may be recommended that you take a prescription medicine daily to prevent HIV infection. This is called pre-exposure prophylaxis (PrEP). You are considered at risk if: ? You are sexually active and do not regularly use condoms or know the HIV status of your partner(s). ? You take drugs by  injection. ? You are sexually active with a partner who has HIV.  Talk with your health care provider about whether you are at high risk of being infected with HIV. If you choose to begin PrEP, you should first be tested for HIV. You should then be tested every 3 months for as long as you are taking PrEP. Pregnancy  If you are premenopausal and you may become pregnant, ask your health care provider about preconception counseling.  If you may become pregnant, take 400 to 800 micrograms (mcg) of folic acid every day.  If you want to prevent pregnancy, talk to your health care provider about birth control (contraception). Osteoporosis and menopause  Osteoporosis is a disease in which the bones lose minerals and strength with aging. This can result in serious bone fractures. Your risk for osteoporosis can be identified using a bone density scan.  If you are 17 years of age or older, or if you are at risk for osteoporosis and fractures, ask your health care provider if you should be screened.  Ask your health care provider whether you should take a calcium or vitamin D supplement to lower your risk for osteoporosis.  Menopause may have certain physical symptoms and risks.  Hormone replacement therapy may reduce some of these symptoms and risks. Talk to your health care provider about whether hormone replacement therapy is right for you. Follow these instructions at home:  Schedule regular health, dental, and eye exams.  Stay current with your immunizations.  Do not use any tobacco products including cigarettes, chewing tobacco, or electronic cigarettes.  If you are pregnant, do not drink alcohol.  If you are breastfeeding, limit how much and how often you drink alcohol.  Limit alcohol intake to no more than 1 drink per day for nonpregnant women. One drink equals 12 ounces of beer, 5 ounces of wine, or 1 ounces of hard liquor.  Do not use street drugs.  Do not share needles.  Ask  your health care provider for help if you need support or information about quitting drugs.  Tell your health care provider if you often feel depressed.  Tell your health care provider if you have ever been abused or do not feel safe at home. This information is not intended to replace advice given to you by your health care provider. Make sure you discuss any questions you have with your health care provider. Document Released: 04/01/2011 Document Revised: 02/22/2016 Document Reviewed: 06/20/2015 Elsevier Interactive Patient Education  [employer].

## 2018-06-11 NOTE — Progress Notes (Signed)
Call pt: cholesterol looks good. LDL is elevated a little but still to goal with no risk factors. Thyroid great. Kidney, liver, glucose look good.  Wbc a little up likely fighting off a virus.

## 2018-06-15 ENCOUNTER — Encounter: Payer: Self-pay | Admitting: Physician Assistant

## 2018-06-15 NOTE — Progress Notes (Signed)
Subjective:     Sandy Rodriguez is a 42 y.o. female and is here for a comprehensive physical exam. The patient reports problems - she does have some skin tags and would like to discuss weight loss. .  Social History   Socioeconomic History  . Marital status: Married    Spouse name: Not on file  . Number of children: Not on file  . Years of education: Not on file  . Highest education level: Not on file  Occupational History  . Not on file  Social Needs  . Financial resource strain: Not on file  . Food insecurity:    Worry: Not on file    Inability: Not on file  . Transportation needs:    Medical: Not on file    Non-medical: Not on file  Tobacco Use  . Smoking status: Never Smoker  . Smokeless tobacco: Never Used  Substance and Sexual Activity  . Alcohol use: Yes  . Drug use: No  . Sexual activity: Yes  Lifestyle  . Physical activity:    Days per week: Not on file    Minutes per session: Not on file  . Stress: Not on file  Relationships  . Social connections:    Talks on phone: Not on file    Gets together: Not on file    Attends religious service: Not on file    Active member of club or organization: Not on file    Attends meetings of clubs or organizations: Not on file    Relationship status: Not on file  . Intimate partner violence:    Fear of current or ex partner: Not on file    Emotionally abused: Not on file    Physically abused: Not on file    Forced sexual activity: Not on file  Other Topics Concern  . Not on file  Social History Narrative  . Not on file   Health Maintenance  Topic Date Due  . MAMMOGRAM  12/07/1995  . INFLUENZA VACCINE  01/28/2019 (Originally 04/30/2018)  . HIV Screening  11/20/2026 (Originally 12/06/1992)  . PAP SMEAR  12/29/2020  . TETANUS/TDAP  06/22/2024    The following portions of the patient's history were reviewed and updated as appropriate: allergies, current medications, past family history, past medical history, past social  history, past surgical history and problem list.  Review of Systems Pertinent items noted in HPI and remainder of comprehensive ROS otherwise negative.   Objective:    BP 107/82   Pulse 67   Ht 5\' 6"  (1.676 m)   Wt 210 lb (95.3 kg)   BMI 33.89 kg/m  General appearance: alert, cooperative, appears stated age and mildly obese Head: Normocephalic, without obvious abnormality, atraumatic Eyes: conjunctivae/corneas clear. PERRL, EOM's intact. Fundi benign. Ears: normal TM's and external ear canals both ears Nose: Nares normal. Septum midline. Mucosa normal. No drainage or sinus tenderness. Throat: lips, mucosa, and tongue normal; teeth and gums normal Neck: no adenopathy, no carotid bruit, no JVD, supple, symmetrical, trachea midline and thyroid not enlarged, symmetric, no tenderness/mass/nodules Back: symmetric, no curvature. ROM normal. No CVA tenderness. Lungs: clear to auscultation bilaterally Heart: regular rate and rhythm, S1, S2 normal, no murmur, click, rub or gallop Abdomen: soft, non-tender; bowel sounds normal; no masses,  no organomegaly Extremities: extremities normal, atraumatic, no cyanosis or edema Pulses: 2+ and symmetric Skin: Skin color, texture, turgor normal. No rashes or lesions or skin tags around neck and in axilla.  left great toenail thick/hyperkeratoic and  yellow.  Lymph nodes: Cervical, supraclavicular, and axillary nodes normal. Neurologic: Alert and oriented X 3, normal strength and tone. Normal symmetric reflexes. Normal coordination and gait    Assessment:    Healthy female exam.      Plan:  Marland KitchenMarland KitchenKimmberly was seen today for annual exam.  Diagnoses and all orders for this visit:  Routine physical examination -     Lipid Panel w/reflex Direct LDL -     COMPLETE METABOLIC PANEL WITH GFR -     TSH -     CBC with Differential/Platelet  Visit for screening mammogram -     MM 3D SCREEN BREAST BILATERAL  Accessory skin tags  Class 1 obesity due to excess  calories without serious comorbidity with body mass index (BMI) of 33.0 to 33.9 in adult -     buPROPion (WELLBUTRIN SR) 150 MG 12 hr tablet; Take 1 tablet (150 mg total) by mouth 2 (two) times daily. -     TSH  Screening for lipid disorders -     Lipid Panel w/reflex Direct LDL  Screening for diabetes mellitus -     COMPLETE METABOLIC PANEL WITH GFR  Toenail fungus -     terbinafine (LAMISIL) 250 MG tablet; Take 1 tablet (250 mg total) by mouth daily.   .. Depression screen [facility] 2/9 06/10/2018 10/07/2017 12/18/2016  Decreased Interest 0 0 0  Down, Depressed, Hopeless 0 0 0  PHQ - 2 Score 0 0 0  Altered sleeping 1 1 -  Tired, decreased energy 0 0 -  Change in appetite 2 1 -  Feeling bad or failure about yourself  0 0 -  Trouble concentrating 0 0 -  Moving slowly or fidgety/restless 0 0 -  Suicidal thoughts 0 0 -  PHQ-9 Score 3 2 -  Difficult doing work/chores Not difficult at all Not difficult at all -   .. GAD 7 : Generalized Anxiety Score 06/10/2018 10/07/2017  Nervous, Anxious, on Edge 0 0  Control/stop worrying 0 0  Worry too much - different things 0 0  Trouble relaxing 1 1  Restless 0 0  Easily annoyed or irritable 0 0  Afraid - awful might happen 0 0  Total GAD 7 Score 1 1  Anxiety Difficulty Not difficult at all Not difficult at all    Encouraged vitamin D 1000 units and Calcium 1300mg  or 4 servings of dairy a day.  Marland Kitchen.Discussed low carb diet with 1500 calories and 80g of protein.  Exercising at least 150 minutes a week.  My Fitness Pal could be a Chief Technology Officer.  Started wellbutrin bid. Follow up in 3 months. Discussed side effects. Check insurance for other weight loss coverage.  Fasting labs ordered.  Mammogram ordered.  Pap done with GYN.  Declined flu shot.   Toenail fungus still not cleared on penlac. Would like something else. Check liver function 1 month after starting. Pt aware could take 3-6 months of anti-fungal therapy.   Skin Tag Removal Procedure  Note  Pre-operative Diagnosis: Classic skin tags (acrochordon)  Post-operative Diagnosis: Classic skin tags (acrochordon)  Locations:around neck and bilateral axilla  Indications: irritation, bleeding, catching or jewerly and being cut during shaving.   Procedure Details  The risks (including bleeding and infection) and benefits of the procedure and Verbal informed consent obtained. Using sterile iris scissors, multiple skin tags were snipped off at their bases after cleansing with Betadine.  Bleeding was controlled by pressure.   Findings: Pathognomonic benign lesions  not  sent for pathological exam.  Condition: Stable  Complications: none.  Plan: 1. Instructed to keep the wounds dry and covered for 24-48h and clean thereafter. 2. Warning signs of infection were reviewed.   3. Recommended that the patient use OTC acetaminophen as needed for pain.  4. Return as needed.     See After Visit Summary for Counseling Recommendations

## 2018-09-07 DIAGNOSIS — Z1231 Encounter for screening mammogram for malignant neoplasm of breast: Secondary | ICD-10-CM | POA: Diagnosis not present

## 2018-10-02 DIAGNOSIS — R928 Other abnormal and inconclusive findings on diagnostic imaging of breast: Secondary | ICD-10-CM | POA: Diagnosis not present

## 2018-10-02 LAB — HM MAMMOGRAPHY

## 2018-10-07 ENCOUNTER — Encounter: Payer: Self-pay | Admitting: Physician Assistant

## 2018-12-04 ENCOUNTER — Ambulatory Visit (INDEPENDENT_AMBULATORY_CARE_PROVIDER_SITE_OTHER): Payer: BLUE CROSS/BLUE SHIELD | Admitting: Sports Medicine

## 2018-12-04 ENCOUNTER — Encounter: Payer: Self-pay | Admitting: Sports Medicine

## 2018-12-04 ENCOUNTER — Ambulatory Visit (INDEPENDENT_AMBULATORY_CARE_PROVIDER_SITE_OTHER): Payer: BLUE CROSS/BLUE SHIELD

## 2018-12-04 DIAGNOSIS — M25571 Pain in right ankle and joints of right foot: Secondary | ICD-10-CM

## 2018-12-04 DIAGNOSIS — M7731 Calcaneal spur, right foot: Secondary | ICD-10-CM

## 2018-12-04 DIAGNOSIS — M7989 Other specified soft tissue disorders: Secondary | ICD-10-CM | POA: Diagnosis not present

## 2018-12-04 MED ORDER — CELECOXIB 200 MG PO CAPS: ORAL_CAPSULE | ORAL | 2 refills | Status: DC

## 2018-12-04 NOTE — Progress Notes (Signed)
Subjective:    CC: Right ankle pain  HPI: This is a pleasant 43 year old female, we treated her for right retrocalcaneal bursitis approximately 9 months ago and she recovered completely.  She now has a new pain, present for the past couple weeks, localized on the anterolateral right ankle.  Worse with weightbearing, moderate, persistent without radiation, no trauma, no mechanical symptoms.  I reviewed the past medical history, family history, social history, surgical history, and allergies today and no changes were needed.  Please see the problem list section below in epic for further details.  Past Medical History: No past medical history on file. Past Surgical History: Past Surgical History:  Procedure Laterality Date  . BREAST REDUCTION SURGERY  1998  . CHOLECYSTECTOMY     Social History: Social History   Socioeconomic History  . Marital status: Married    Spouse name: Not on file  . Number of children: Not on file  . Years of education: Not on file  . Highest education level: Not on file  Occupational History  . Not on file  Social Needs  . Financial resource strain: Not on file  . Food insecurity:    Worry: Not on file    Inability: Not on file  . Transportation needs:    Medical: Not on file    Non-medical: Not on file  Tobacco Use  . Smoking status: Never Smoker  . Smokeless tobacco: Never Used  Substance and Sexual Activity  . Alcohol use: Yes  . Drug use: No  . Sexual activity: Yes  Lifestyle  . Physical activity:    Days per week: Not on file    Minutes per session: Not on file  . Stress: Not on file  Relationships  . Social connections:    Talks on phone: Not on file    Gets together: Not on file    Attends religious service: Not on file    Active member of club or organization: Not on file    Attends meetings of clubs or organizations: Not on file    Relationship status: Not on file  Other Topics Concern  . Not on file  Social History Narrative    . Not on file   Family History: Family History  Problem Relation Age of Onset  . Hypertension Mother   . Cancer Father        non hodgkins lymphoma  . Stroke Paternal Aunt   . Heart attack Maternal Grandfather   . Stroke Paternal Grandmother    Allergies: Allergies  Allergen Reactions  . Belviq [Lorcaserin Hcl]     Makes her feel weird.   . Sulfonamide Derivatives    Medications: See med rec.  Review of Systems: No fevers, chills, night sweats, weight loss, chest pain, or shortness of breath.   Objective:    General: Well Developed, well nourished, and in no acute distress.  Neuro: Alert and oriented x3, extra-ocular muscles intact, sensation grossly intact.  HEENT: Normocephalic, atraumatic, pupils equal round reactive to light, neck supple, no masses, no lymphadenopathy, thyroid nonpalpable.  Skin: Warm and dry, no rashes. Cardiac: Regular rate and rhythm, no murmurs rubs or gallops, no lower extremity edema.  Respiratory: Clear to auscultation bilaterally. Not using accessory muscles, speaking in full sentences. Right ankle: No visible erythema or swelling. Range of motion is full in all directions. Strength is 5/5 in all directions. Stable lateral and medial ligaments; squeeze test and kleiger test unremarkable; Talar dome nontender; No pain at base of  5th MT; No tenderness over cuboid; No tenderness over N spot or navicular prominence No tenderness on posterior aspects of lateral and medial malleolus Tender to palpation at the sinus tarsi. No sign of peroneal tendon subluxations; Negative tarsal tunnel tinel's Able to walk 4 steps.  Impression and Recommendations:    Sinus tarsitis, right Heel lift, ASO, switching to Celebrex. X-rays. Rehab exercises given. Return to see me in a month, MRI versus injection if no better.    ___________________________________________ Ihor Austin. Benjamin Stain, M.D., ABFM., CAQSM. Primary Care and Sports Medicine [facility]  Adjunct Professor of Family Medicine  [employer]

## 2018-12-04 NOTE — Assessment & Plan Note (Signed)
Heel lift, ASO, switching to Celebrex. X-rays. Rehab exercises given. Return to see me in a month, MRI versus injection if no better.

## 2019-01-04 ENCOUNTER — Other Ambulatory Visit: Payer: Self-pay

## 2019-01-04 ENCOUNTER — Encounter: Payer: Self-pay | Admitting: Sports Medicine

## 2019-01-04 ENCOUNTER — Ambulatory Visit (INDEPENDENT_AMBULATORY_CARE_PROVIDER_SITE_OTHER): Payer: BLUE CROSS/BLUE SHIELD | Admitting: Sports Medicine

## 2019-01-04 DIAGNOSIS — M25571 Pain in right ankle and joints of right foot: Secondary | ICD-10-CM

## 2019-01-04 NOTE — Assessment & Plan Note (Signed)
Doing better but not perfect. Celebrex seems to be effective at once daily use. She understands that she can go up to twice a day if needed. I would like her to wear slippers or tennis shoes in the house, and be a little more consistent with the rehab exercises. Return to see me as needed, we can certainly add formal PT, versus an injection if persistent discomfort. For clarification she does have a bit of discomfort at the Achilles insertion which seems to be worse now than her sinus tarsi symptoms.

## 2019-01-04 NOTE — Progress Notes (Signed)
Subjective:    CC: Follow-up  HPI: Right sinus tarsi syndrome: Improved considerably with Celebrex, rehab exercises, heel lifts.  She has a bit of pain in the posterior ankle, but has been walking around the house barefoot.  Moderately inconsistent with rehab exercises.  I reviewed the past medical history, family history, social history, surgical history, and allergies today and no changes were needed.  Please see the problem list section below in epic for further details.  Past Medical History: No past medical history on file. Past Surgical History: Past Surgical History:  Procedure Laterality Date  . BREAST REDUCTION SURGERY  1998  . CHOLECYSTECTOMY     Social History: Social History   Socioeconomic History  . Marital status: Married    Spouse name: Not on file  . Number of children: Not on file  . Years of education: Not on file  . Highest education level: Not on file  Occupational History  . Not on file  Social Needs  . Financial resource strain: Not on file  . Food insecurity:    Worry: Not on file    Inability: Not on file  . Transportation needs:    Medical: Not on file    Non-medical: Not on file  Tobacco Use  . Smoking status: Never Smoker  . Smokeless tobacco: Never Used  Substance and Sexual Activity  . Alcohol use: Yes  . Drug use: No  . Sexual activity: Yes  Lifestyle  . Physical activity:    Days per week: Not on file    Minutes per session: Not on file  . Stress: Not on file  Relationships  . Social connections:    Talks on phone: Not on file    Gets together: Not on file    Attends religious service: Not on file    Active member of club or organization: Not on file    Attends meetings of clubs or organizations: Not on file    Relationship status: Not on file  Other Topics Concern  . Not on file  Social History Narrative  . Not on file   Family History: Family History  Problem Relation Age of Onset  . Hypertension Mother   . Cancer  Father        non hodgkins lymphoma  . Stroke Paternal Aunt   . Heart attack Maternal Grandfather   . Stroke Paternal Grandmother    Allergies: Allergies  Allergen Reactions  . Belviq [Lorcaserin Hcl]     Makes her feel weird.   . Sulfonamide Derivatives    Medications: See med rec.  Review of Systems: No fevers, chills, night sweats, weight loss, chest pain, or shortness of breath.   Objective:    General: Well Developed, well nourished, and in no acute distress.  Neuro: Alert and oriented x3, extra-ocular muscles intact, sensation grossly intact.  HEENT: Normocephalic, atraumatic, pupils equal round reactive to light, neck supple, no masses, no lymphadenopathy, thyroid nonpalpable.  Skin: Warm and dry, no rashes. Cardiac: Regular rate and rhythm, no murmurs rubs or gallops, no lower extremity edema.  Respiratory: Clear to auscultation bilaterally. Not using accessory muscles, speaking in full sentences.  Impression and Recommendations:    Sinus tarsitis, right Doing better but not perfect. Celebrex seems to be effective at once daily use. She understands that she can go up to twice a day if needed. I would like her to wear slippers or tennis shoes in the house, and be a little more consistent with the rehab  exercises. Return to see me as needed, we can certainly add formal PT, versus an injection if persistent discomfort. For clarification she does have a bit of discomfort at the Achilles insertion which seems to be worse now than her sinus tarsi symptoms.   ___________________________________________ Ihor Austin. Benjamin Stain, M.D., ABFM., CAQSM. Primary Care and Sports Medicine  [facility]  Adjunct Professor of Family Medicine  [employer]

## 2019-04-12 ENCOUNTER — Encounter: Payer: Self-pay | Admitting: Sports Medicine

## 2019-04-13 ENCOUNTER — Other Ambulatory Visit: Payer: Self-pay

## 2019-04-13 ENCOUNTER — Encounter: Payer: Self-pay | Admitting: Sports Medicine

## 2019-04-13 ENCOUNTER — Ambulatory Visit (INDEPENDENT_AMBULATORY_CARE_PROVIDER_SITE_OTHER): Payer: BLUE CROSS/BLUE SHIELD | Admitting: Sports Medicine

## 2019-04-13 DIAGNOSIS — M7751 Other enthesopathy of right foot: Secondary | ICD-10-CM | POA: Diagnosis not present

## 2019-04-13 DIAGNOSIS — L84 Corns and callosities: Secondary | ICD-10-CM | POA: Diagnosis not present

## 2019-04-13 NOTE — Assessment & Plan Note (Signed)
Recurrence of retrocalcaneal bursitis, I injected this about a year ago, she has such a retrocalcaneal bursa effusion today we were able to aspirate about 0.6 mL of yellowish fluid, syringe switched and 1/2 cc Kenalog 40, 1/2 cc lidocaine injected. Great care was taken to avoid intra-Achilles injection. Adding a crystal analysis. Cam boot for 1 week to protect the Achilles.

## 2019-04-13 NOTE — Progress Notes (Signed)
Subjective:    CC: Right heel pain  HPI: This is a pleasant 43 year old female, for the past several weeks she is had worsening pain, swelling, redness in her right heel, a year ago we did a retrocalcaneal bursa injection followed by a week of boot immobilization with excellent results.  Symptoms are similar today.  Severe, worsening, localized without radiation.  I reviewed the past medical history, family history, social history, surgical history, and allergies today and no changes were needed.  Please see the problem list section below in epic for further details.  Past Medical History: No past medical history on file. Past Surgical History: Past Surgical History:  Procedure Laterality Date  . BREAST REDUCTION SURGERY  1998  . CHOLECYSTECTOMY     Social History: Social History   Socioeconomic History  . Marital status: Married    Spouse name: Not on file  . Number of children: Not on file  . Years of education: Not on file  . Highest education level: Not on file  Occupational History  . Not on file  Social Needs  . Financial resource strain: Not on file  . Food insecurity    Worry: Not on file    Inability: Not on file  . Transportation needs    Medical: Not on file    Non-medical: Not on file  Tobacco Use  . Smoking status: Never Smoker  . Smokeless tobacco: Never Used  Substance and Sexual Activity  . Alcohol use: Yes  . Drug use: No  . Sexual activity: Yes  Lifestyle  . Physical activity    Days per week: Not on file    Minutes per session: Not on file  . Stress: Not on file  Relationships  . Social Musicianconnections    Talks on phone: Not on file    Gets together: Not on file    Attends religious service: Not on file    Active member of club or organization: Not on file    Attends meetings of clubs or organizations: Not on file    Relationship status: Not on file  Other Topics Concern  . Not on file  Social History Narrative  . Not on file   Family  History: Family History  Problem Relation Age of Onset  . Hypertension Mother   . Cancer Father        non hodgkins lymphoma  . Stroke Paternal Aunt   . Heart attack Maternal Grandfather   . Stroke Paternal Grandmother    Allergies: Allergies  Allergen Reactions  . Belviq [Lorcaserin Hcl]     Makes her feel weird.   . Sulfonamide Derivatives    Medications: See med rec.  Review of Systems: No fevers, chills, night sweats, weight loss, chest pain, or shortness of breath.   Objective:    General: Well Developed, well nourished, and in no acute distress.  Neuro: Alert and oriented x3, extra-ocular muscles intact, sensation grossly intact.  HEENT: Normocephalic, atraumatic, pupils equal round reactive to light, neck supple, no masses, no lymphadenopathy, thyroid nonpalpable.  Skin: Warm and dry, no rashes. Cardiac: Regular rate and rhythm, no murmurs rubs or gallops, no lower extremity edema.  Respiratory: Clear to auscultation bilaterally. Not using accessory muscles, speaking in full sentences. Right heel: Swollen, red, tender retrocalcaneal bursa.  Procedure: Real-time Ultrasound Guided aspiration/injection of right retrocalcaneal bursa Device: GE Logiq E  Verbal informed consent obtained.  Time-out conducted.  Noted no overlying erythema, induration, or other signs of local infection.  Skin prepped in a sterile fashion.  Local anesthesia: Topical Ethyl chloride.  With sterile technique and under real time ultrasound guidance:  I advanced a 22-gauge needle into the bursa, aspirated approximately 0.6 mL of yellowish fluid, syringe switched and 1/2 cc Kenalog 40, 1/2 cc lidocaine injected easily Completed without difficulty  Pain immediately resolved suggesting accurate placement of the medication.  Advised to call if fevers/chills, erythema, induration, drainage, or persistent bleeding.  Images permanently stored and available for review in the ultrasound unit.  Impression:  Technically successful ultrasound guided injection.  Impression and Recommendations:    Retrocalcaneal bursitis, right Recurrence of retrocalcaneal bursitis, I injected this about a year ago, she has such a retrocalcaneal bursa effusion today we were able to aspirate about 0.6 mL of yellowish fluid, syringe switched and 1/2 cc Kenalog 40, 1/2 cc lidocaine injected. Great care was taken to avoid intra-Achilles injection. Adding a crystal analysis. Cam boot for 1 week to protect the Achilles.   Callus of heel There is fairly severe right heel callus, with chronic cracked heels. This is easily treatable with deeply paring the callus but not too deep to cause bleeding. She will talk to her PCP about this. We have #10 blades which would be perfect for this.   ___________________________________________ Gwen Her. Dianah Field, M.D., ABFM., CAQSM. Primary Care and Sports Medicine [place] [facility]  Adjunct Professor of [employer] Medicine

## 2019-04-13 NOTE — Assessment & Plan Note (Signed)
There is fairly severe right heel callus, with chronic cracked heels. This is easily treatable with deeply paring the callus but not too deep to cause bleeding. She will talk to her PCP about this. We have #10 blades which would be perfect for this.

## 2019-04-14 LAB — SYNOVIAL FLUID, CRYSTAL

## 2019-05-11 ENCOUNTER — Encounter: Payer: Self-pay | Admitting: Sports Medicine

## 2019-05-11 ENCOUNTER — Other Ambulatory Visit: Payer: Self-pay

## 2019-05-11 ENCOUNTER — Ambulatory Visit (INDEPENDENT_AMBULATORY_CARE_PROVIDER_SITE_OTHER): Payer: BLUE CROSS/BLUE SHIELD | Admitting: Sports Medicine

## 2019-05-11 DIAGNOSIS — M7751 Other enthesopathy of right foot: Secondary | ICD-10-CM | POA: Diagnosis not present

## 2019-05-11 NOTE — Assessment & Plan Note (Signed)
Recurrence of retrocalcaneal bursitis, we injected this at the last visit and she returns today pain-free. I was able to aspirate the retrocalcaneal bursal fluid which came back negative for any crystals. She wore a boot for a week to protect the Achilles after the injection. Return as needed.

## 2019-05-11 NOTE — Progress Notes (Signed)
Subjective:    CC: Follow-up  HPI: Right retrocalcaneal bursitis: Resolved after injection.  I reviewed the past medical history, family history, social history, surgical history, and allergies today and no changes were needed.  Please see the problem list section below in epic for further details.  Past Medical History: No past medical history on file. Past Surgical History: Past Surgical History:  Procedure Laterality Date  . BREAST REDUCTION SURGERY  1998  . CHOLECYSTECTOMY     Social History: Social History   Socioeconomic History  . Marital status: Married    Spouse name: Not on file  . Number of children: Not on file  . Years of education: Not on file  . Highest education level: Not on file  Occupational History  . Not on file  Social Needs  . Financial resource strain: Not on file  . Food insecurity    Worry: Not on file    Inability: Not on file  . Transportation needs    Medical: Not on file    Non-medical: Not on file  Tobacco Use  . Smoking status: Never Smoker  . Smokeless tobacco: Never Used  Substance and Sexual Activity  . Alcohol use: Yes  . Drug use: No  . Sexual activity: Yes  Lifestyle  . Physical activity    Days per week: Not on file    Minutes per session: Not on file  . Stress: Not on file  Relationships  . Social Herbalist on phone: Not on file    Gets together: Not on file    Attends religious service: Not on file    Active member of club or organization: Not on file    Attends meetings of clubs or organizations: Not on file    Relationship status: Not on file  Other Topics Concern  . Not on file  Social History Narrative  . Not on file   Family History: Family History  Problem Relation Age of Onset  . Hypertension Mother   . Cancer Father        non hodgkins lymphoma  . Stroke Paternal Aunt   . Heart attack Maternal Grandfather   . Stroke Paternal Grandmother    Allergies: Allergies  Allergen Reactions  .  Belviq [Lorcaserin Hcl]     Makes her feel weird.   . Sulfonamide Derivatives    Medications: See med rec.  Review of Systems: No fevers, chills, night sweats, weight loss, chest pain, or shortness of breath.   Objective:    General: Well Developed, well nourished, and in no acute distress.  Neuro: Alert and oriented x3, extra-ocular muscles intact, sensation grossly intact.  HEENT: Normocephalic, atraumatic, pupils equal round reactive to light, neck supple, no masses, no lymphadenopathy, thyroid nonpalpable.  Skin: Warm and dry, no rashes. Cardiac: Regular rate and rhythm, no murmurs rubs or gallops, no lower extremity edema.  Respiratory: Clear to auscultation bilaterally. Not using accessory muscles, speaking in full sentences.  Impression and Recommendations:    Retrocalcaneal bursitis, right Recurrence of retrocalcaneal bursitis, we injected this at the last visit and she returns today pain-free. I was able to aspirate the retrocalcaneal bursal fluid which came back negative for any crystals. She wore a boot for a week to protect the Achilles after the injection. Return as needed.   ___________________________________________ Gwen Her. Dianah Field, M.D., ABFM., CAQSM. Primary Care and Sports Medicine [place] [facility]  Adjunct Professor of [employer] [facility] of  Medicine

## 2019-06-09 DIAGNOSIS — L821 Other seborrheic keratosis: Secondary | ICD-10-CM | POA: Diagnosis not present

## 2019-06-09 DIAGNOSIS — L814 Other melanin hyperpigmentation: Secondary | ICD-10-CM | POA: Diagnosis not present

## 2019-06-09 DIAGNOSIS — D485 Neoplasm of uncertain behavior of skin: Secondary | ICD-10-CM | POA: Diagnosis not present

## 2019-06-09 DIAGNOSIS — L820 Inflamed seborrheic keratosis: Secondary | ICD-10-CM | POA: Diagnosis not present

## 2019-06-09 DIAGNOSIS — D225 Melanocytic nevi of trunk: Secondary | ICD-10-CM | POA: Diagnosis not present

## 2019-07-06 DIAGNOSIS — D485 Neoplasm of uncertain behavior of skin: Secondary | ICD-10-CM | POA: Diagnosis not present

## 2019-07-06 DIAGNOSIS — L905 Scar conditions and fibrosis of skin: Secondary | ICD-10-CM | POA: Diagnosis not present

## 2020-01-03 ENCOUNTER — Ambulatory Visit: Payer: Self-pay | Admitting: Sports Medicine

## 2020-01-03 IMAGING — MR MR FOOT*R* W/O CM
5 series · 40 of 40 positions shown · non-contrast
Comparison: Radiographs dated 11/06/2017

CLINICAL DATA: Right foot and ankle pain since July 2017.
Swelling.

EXAM:
MRI OF THE RIGHT HINDFOOT WITHOUT CONTRAST
TECHNIQUE: Multiplanar, multisequence MR imaging of the right hindfoot was
performed. No intravenous contrast was administered.

[Series 3: PD fat-sat · axial · 3.0mm · 0.66mm/px · z∈[-105,+33]mm · 9 of 43 slices shown]
[im 1/43]
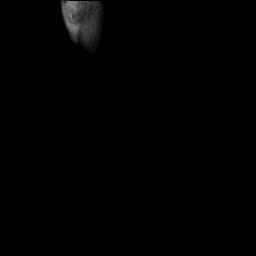
[im 6/43]
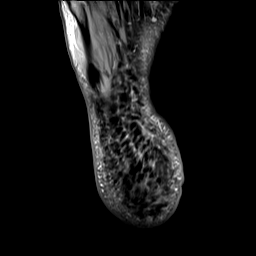
[im 11/43]
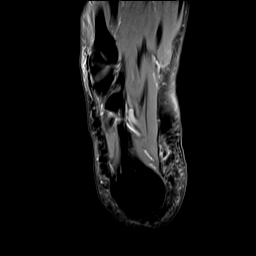
[im 16/43]
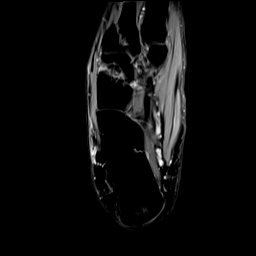
[im 22/43]
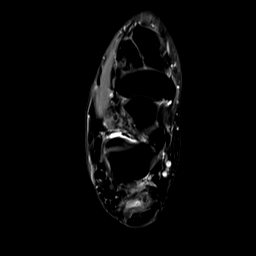
[im 27/43]
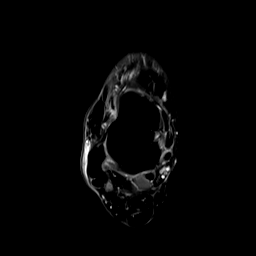
[im 32/43]
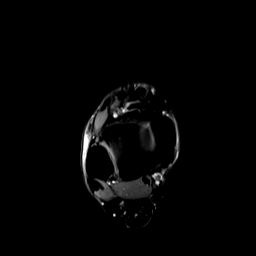
[im 37/43]
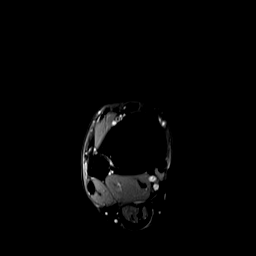
[im 43/43]
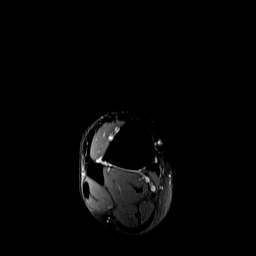

[Series 4: T2 fat-sat · axial · 3.0mm · 0.66mm/px · z∈[-105,+33]mm · 9 of 43 slices shown (1 of 3)]
[im 1/43]
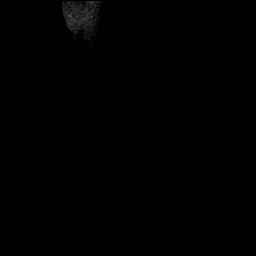
[im 6/43]
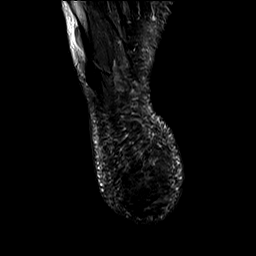
[im 11/43]
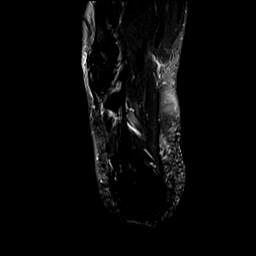
[im 16/43]
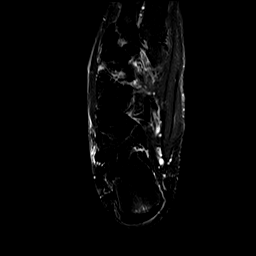
[im 22/43]
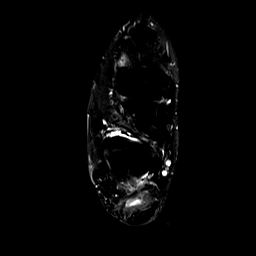
[im 27/43]
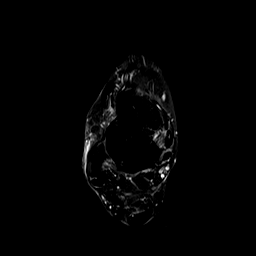
[im 32/43]
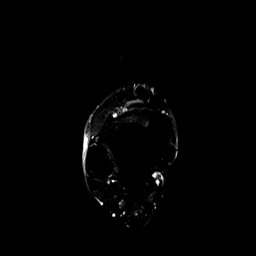
[im 37/43]
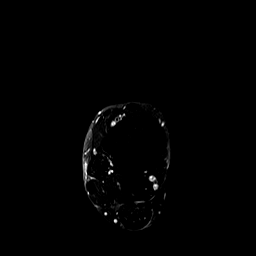
[im 43/43]
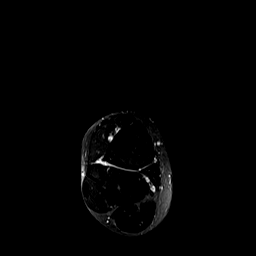

[Series 5: T2 fat-sat · coronal · 3.0mm · 0.70mm/px · 10 of 45 slices shown (2 of 3)]
[im 1/45]
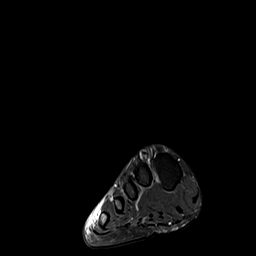
[im 5/45]
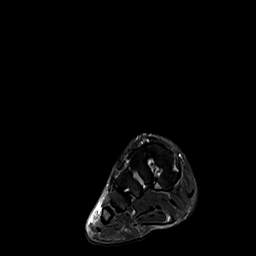
[im 10/45]
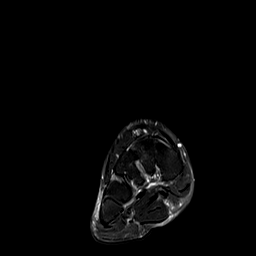
[im 15/45]
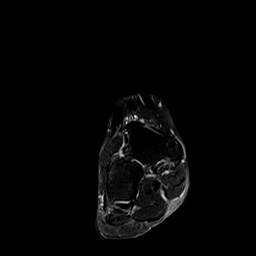
[im 20/45]
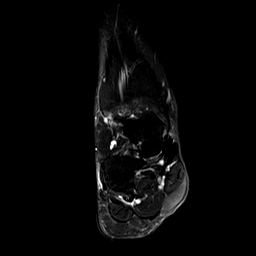
[im 25/45]
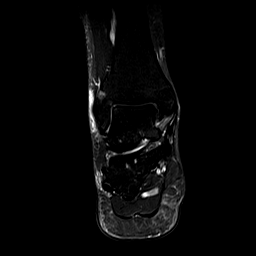
[im 30/45]
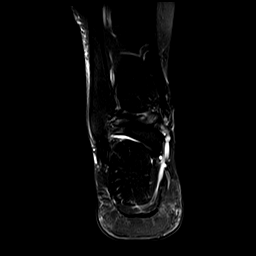
[im 35/45]
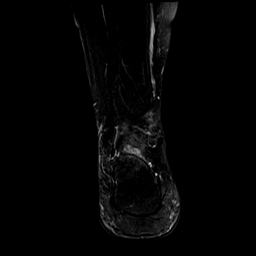
[im 40/45]
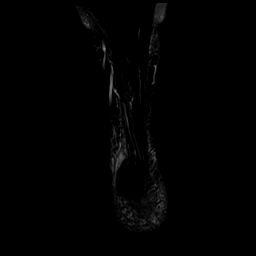
[im 45/45]
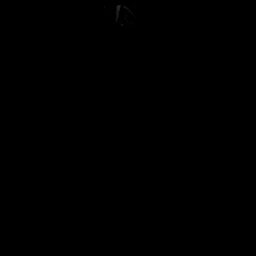

[Series 6: T1 · sagittal · 3.0mm · 0.56mm/px · 6 of 26 slices shown]
[im 1/26]
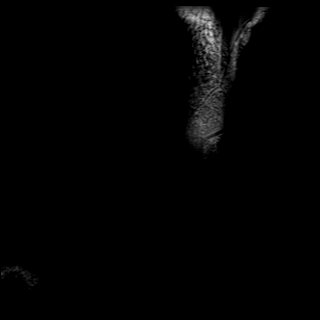
[im 6/26]
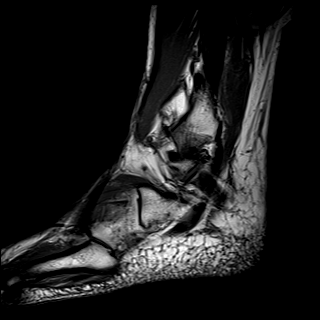
[im 11/26]
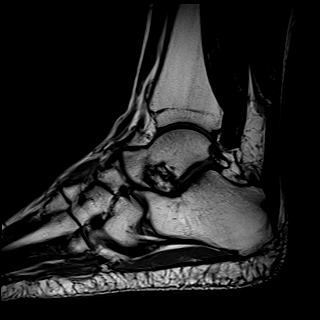
[im 16/26]
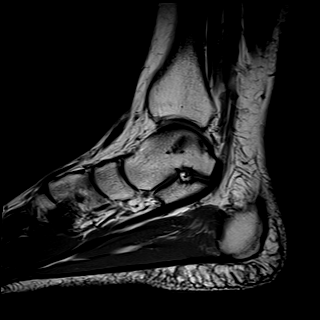
[im 21/26]
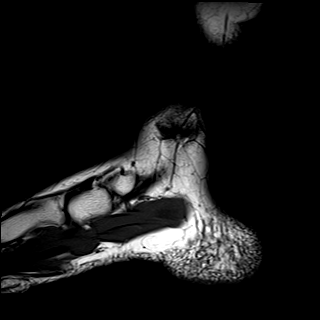
[im 26/26]
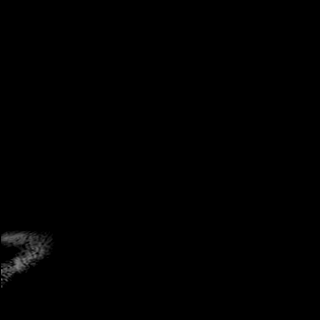

[Series 7: T2 fat-sat · sagittal · 3.0mm · 0.70mm/px · 6 of 26 slices shown (3 of 3)]
[im 1/26]
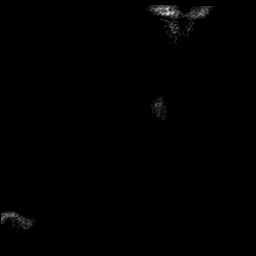
[im 6/26]
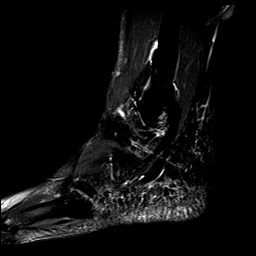
[im 11/26]
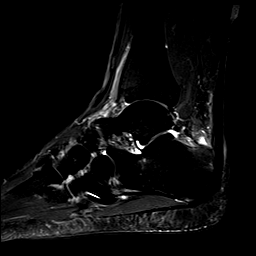
[im 16/26]
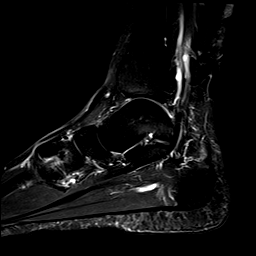
[im 21/26]
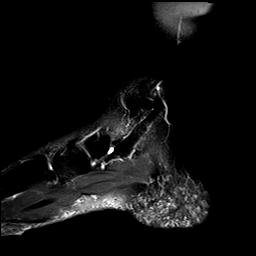
[im 26/26]
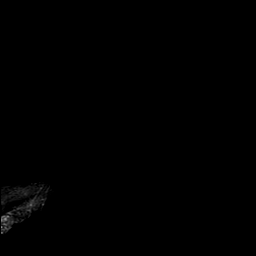

[40 of 40 positions shown; findings below may reference images not displayed]

FINDINGS: Bones: There is a small focal erosion of the posterior aspect of the
calcaneus at the Achilles insertion. The bones otherwise appear
normal. Specifically, no evidence of fracture or stress injury fifth
metatarsal.

TENDONS

Peroneal: Normal.

Posteromedial: Normal.

Anterior: Normal.

Achilles: Distal Achilles tendinopathy and tendinitis. Inflammation
of the retrocalcaneal bursa with some edema and a small erosion at
the Achilles insertion on the posterior aspect of the calcaneus.

Plantar Fascia: Normal.

LIGAMENTS

Lateral: Normal.

Medial: Normal.

CARTILAGE

Ankle Joint: Normal.

Subtalar Joints/Sinus Tarsi: Normal.

Other: Subtle nonspecific edema in the subcutaneous fat superficial
to the lateral malleolus. Edema in the subcutaneous fat over the
dorsal lateral aspect of the fifth metatarsal, nonspecific.
IMPRESSION: 1. Distal Achilles tendinopathy and tendinitis with retrocalcaneal
bursitis.
2. No evidence of stress fracture or other significant bone
abnormality other than the changes in the posterior calcaneus at the
Achilles insertion.
3. Nonspecific subcutaneous edema overlying the fifth metatarsal and
lateral malleolus.

## 2020-02-21 DIAGNOSIS — Z6828 Body mass index (BMI) 28.0-28.9, adult: Secondary | ICD-10-CM | POA: Diagnosis not present

## 2020-02-21 DIAGNOSIS — Z30433 Encounter for removal and reinsertion of intrauterine contraceptive device: Secondary | ICD-10-CM | POA: Diagnosis not present

## 2020-02-21 DIAGNOSIS — Z01419 Encounter for gynecological examination (general) (routine) without abnormal findings: Secondary | ICD-10-CM | POA: Diagnosis not present

## 2020-02-23 DIAGNOSIS — Z1231 Encounter for screening mammogram for malignant neoplasm of breast: Secondary | ICD-10-CM | POA: Diagnosis not present

## 2020-02-23 LAB — HM MAMMOGRAPHY

## 2020-02-24 LAB — HM MAMMOGRAPHY

## 2020-03-06 ENCOUNTER — Encounter: Payer: Self-pay | Admitting: Physician Assistant

## 2020-03-08 ENCOUNTER — Encounter: Payer: Self-pay | Admitting: Physician Assistant

## 2020-03-08 ENCOUNTER — Ambulatory Visit (INDEPENDENT_AMBULATORY_CARE_PROVIDER_SITE_OTHER): Payer: BLUE CROSS/BLUE SHIELD | Admitting: Physician Assistant

## 2020-03-08 VITALS — BP 112/78 | HR 68 | Ht 66.0 in | Wt 177.0 lb

## 2020-03-08 DIAGNOSIS — Z1159 Encounter for screening for other viral diseases: Secondary | ICD-10-CM | POA: Diagnosis not present

## 2020-03-08 DIAGNOSIS — Z13220 Encounter for screening for lipoid disorders: Secondary | ICD-10-CM

## 2020-03-08 DIAGNOSIS — Z0000 Encounter for general adult medical examination without abnormal findings: Secondary | ICD-10-CM | POA: Diagnosis not present

## 2020-03-08 DIAGNOSIS — Z131 Encounter for screening for diabetes mellitus: Secondary | ICD-10-CM | POA: Diagnosis not present

## 2020-03-08 DIAGNOSIS — L7211 Pilar cyst: Secondary | ICD-10-CM

## 2020-03-08 NOTE — Patient Instructions (Signed)
Health Maintenance, Female Adopting a healthy lifestyle and getting preventive care are important in promoting health and wellness. Ask your health care provider about:  The right schedule for you to have regular tests and exams.  Things you can do on your own to prevent diseases and keep yourself healthy. What should I know about diet, weight, and exercise? Eat a healthy diet   Eat a diet that includes plenty of vegetables, fruits, low-fat dairy products, and lean protein.  Do not eat a lot of foods that are high in solid fats, added sugars, or sodium. Maintain a healthy weight Body mass index (BMI) is used to identify weight problems. It estimates body fat based on height and weight. Your health care provider can help determine your BMI and help you achieve or maintain a healthy weight. Get regular exercise Get regular exercise. This is one of the most important things you can do for your health. Most adults should:  Exercise for at least 150 minutes each week. The exercise should increase your heart rate and make you sweat (moderate-intensity exercise).  Do strengthening exercises at least twice a week. This is in addition to the moderate-intensity exercise.  Spend less time sitting. Even light physical activity can be beneficial. Watch cholesterol and blood lipids Have your blood tested for lipids and cholesterol at 42 years of age, then have this test every 5 years. Have your cholesterol levels checked more often if:  Your lipid or cholesterol levels are high.  You are older than 42 years of age.  You are at high risk for heart disease. What should I know about cancer screening? Depending on your health history and family history, you may need to have cancer screening at various ages. This may include screening for:  Breast cancer.  Cervical cancer.  Colorectal cancer.  Skin cancer.  Lung cancer. What should I know about heart disease, diabetes, and high blood  pressure? Blood pressure and heart disease  High blood pressure causes heart disease and increases the risk of stroke. This is more likely to develop in people who have high blood pressure readings, are of African descent, or are overweight.  Have your blood pressure checked: ? Every 3-5 years if you are 18-39 years of age. ? Every year if you are 40 years old or older. Diabetes Have regular diabetes screenings. This checks your fasting blood sugar level. Have the screening done:  Once every three years after age 40 if you are at a normal weight and have a low risk for diabetes.  More often and at a younger age if you are overweight or have a high risk for diabetes. What should I know about preventing infection? Hepatitis B If you have a higher risk for hepatitis B, you should be screened for this virus. Talk with your health care provider to find out if you are at risk for hepatitis B infection. Hepatitis C Testing is recommended for:  Everyone born from 1945 through 1965.  Anyone with known risk factors for hepatitis C. Sexually transmitted infections (STIs)  Get screened for STIs, including gonorrhea and chlamydia, if: ? You are sexually active and are younger than 42 years of age. ? You are older than 42 years of age and your health care provider tells you that you are at risk for this type of infection. ? Your sexual activity has changed since you were last screened, and you are at increased risk for chlamydia or gonorrhea. Ask your health care provider if   you are at risk.  Ask your health care provider about whether you are at high risk for HIV. Your health care provider may recommend a prescription medicine to help prevent HIV infection. If you choose to take medicine to prevent HIV, you should first get tested for HIV. You should then be tested every 3 months for as long as you are taking the medicine. Pregnancy  If you are about to stop having your period (premenopausal) and  you may become pregnant, seek counseling before you get pregnant.  Take 400 to 800 micrograms (mcg) of folic acid every day if you become pregnant.  Ask for birth control (contraception) if you want to prevent pregnancy. Osteoporosis and menopause Osteoporosis is a disease in which the bones lose minerals and strength with aging. This can result in bone fractures. If you are 65 years old or older, or if you are at risk for osteoporosis and fractures, ask your health care provider if you should:  Be screened for bone loss.  Take a calcium or vitamin D supplement to lower your risk of fractures.  Be given hormone replacement therapy (HRT) to treat symptoms of menopause. Follow these instructions at home: Lifestyle  Do not use any products that contain nicotine or tobacco, such as cigarettes, e-cigarettes, and chewing tobacco. If you need help quitting, ask your health care provider.  Do not use street drugs.  Do not share needles.  Ask your health care provider for help if you need support or information about quitting drugs. Alcohol use  Do not drink alcohol if: ? Your health care provider tells you not to drink. ? You are pregnant, may be pregnant, or are planning to become pregnant.  If you drink alcohol: ? Limit how much you use to 0-1 drink a day. ? Limit intake if you are breastfeeding.  Be aware of how much alcohol is in your drink. In the [place] one drink equals one 12 oz bottle of beer (355 mL), one 5 oz glass of wine (148 mL), or one 1 oz glass of hard liquor (44 mL). General instructions  Schedule regular health, dental, and eye exams.  Stay current with your vaccines.  Tell your health care provider if: ? You often feel depressed. ? You have ever been abused or do not feel safe at home. Summary  Adopting a healthy lifestyle and getting preventive care are important in promoting health and wellness.  Follow your health care provider's instructions about healthy  diet, exercising, and getting tested or screened for diseases.  Follow your health care provider's instructions on monitoring your cholesterol and blood pressure. This information is not intended to replace advice given to you by your health care provider. Make sure you discuss any questions you have with your health care provider. Document Revised: 09/09/2018 Document Reviewed: 09/09/2018 Elsevier Patient Education  2020 [employer]  

## 2020-03-08 NOTE — Progress Notes (Signed)
Subjective:     Sandy Rodriguez is a 44 y.o. female and is here for a comprehensive physical exam. The patient reports problems - no problems. .   Social History   Socioeconomic History  . Marital status: Married    Spouse name: Not on file  . Number of children: Not on file  . Years of education: Not on file  . Highest education level: Not on file  Occupational History  . Not on file  Tobacco Use  . Smoking status: Never Smoker  . Smokeless tobacco: Never Used  Substance and Sexual Activity  . Alcohol use: Yes  . Drug use: No  . Sexual activity: Yes  Other Topics Concern  . Not on file  Social History Narrative  . Not on file   Social Determinants of Health   Financial Resource Strain:   . Difficulty of Paying Living Expenses:   Food Insecurity:   . Worried About Programme researcher, broadcasting/film/video in the Last Year:   . Barista in the Last Year:   Transportation Needs:   . Freight forwarder (Medical):   Marland Kitchen Lack of Transportation (Non-Medical):   Physical Activity:   . Days of Exercise per Week:   . Minutes of Exercise per Session:   Stress:   . Feeling of Stress :   Social Connections:   . Frequency of Communication with Friends and Family:   . Frequency of Social Gatherings with Friends and Family:   . Attends Religious Services:   . Active Member of Clubs or Organizations:   . Attends Banker Meetings:   Marland Kitchen Marital Status:   Intimate Partner Violence:   . Fear of Current or Ex-Partner:   . Emotionally Abused:   Marland Kitchen Physically Abused:   . Sexually Abused:    Health Maintenance  Topic Date Due  . Hepatitis C Screening  Never done  . HIV Screening  11/20/2026 (Originally 12/06/1990)  . INFLUENZA VACCINE  04/30/2020  . PAP SMEAR-Modifier  12/29/2020  . MAMMOGRAM  02/23/2021  . TETANUS/TDAP  06/22/2024  . COVID-19 Vaccine  Completed    The following portions of the patient's history were reviewed and updated as appropriate: allergies, current  medications, past family history, past medical history, past social history, past surgical history and problem list.  Review of Systems A comprehensive review of systems was negative.   Objective:    BP 112/78   Pulse 68   Ht 5\' 6"  (1.676 m)   Wt 177 lb (80.3 kg)   SpO2 99%   BMI 28.57 kg/m  General appearance: alert, cooperative and appears stated age Head: Normocephalic, without obvious abnormality, atraumatic Eyes: conjunctivae/corneas clear. PERRL, EOM's intact. Fundi benign. Ears: normal TM's and external ear canals both ears Nose: Nares normal. Septum midline. Mucosa normal. No drainage or sinus tenderness. Throat: lips, mucosa, and tongue normal; teeth and gums normal Neck: no adenopathy, no carotid bruit, no JVD, supple, symmetrical, trachea midline and thyroid not enlarged, symmetric, no tenderness/mass/nodules Back: symmetric, no curvature. ROM normal. No CVA tenderness. Lungs: clear to auscultation bilaterally Heart: regular rate and rhythm, S1, S2 normal, no murmur, click, rub or gallop Abdomen: soft, non-tender; bowel sounds normal; no masses,  no organomegaly Extremities: extremities normal, atraumatic, no cyanosis or edema Pulses: 2+ and symmetric Skin: Skin color, texture, turgor normal. No rashes or lesions firm mobile non tender subcutaneous cyst on left scalp just above ear. No redness, warmth, swelling.  Lymph nodes: Cervical, supraclavicular,  and axillary nodes normal. Neurologic: Alert and oriented X 3, normal strength and tone. Normal symmetric reflexes. Normal coordination and gait    .Marland Kitchen Depression screen [facility] 2/9 03/08/2020 06/10/2018 10/07/2017 12/18/2016  Decreased Interest 0 0 0 0  Down, Depressed, Hopeless 0 0 0 0  PHQ - 2 Score 0 0 0 0  Altered sleeping 1 1 1  -  Tired, decreased energy 0 0 0 -  Change in appetite 0 2 1 -  Feeling bad or failure about yourself  0 0 0 -  Trouble concentrating 0 0 0 -  Moving slowly or fidgety/restless 0 0 0 -  Suicidal  thoughts 0 0 0 -  PHQ-9 Score 1 3 2  -  Difficult doing work/chores Not difficult at all Not difficult at all Not difficult at all -    Assessment:    Healthy female exam.      Plan:    Marland KitchenMarland KitchenAmanii was seen today for annual exam.  Diagnoses and all orders for this visit:  Routine physical examination -     Lipid Panel w/reflex Direct LDL -     COMPLETE METABOLIC PANEL WITH GFR -     Hepatitis C Antibody  Screening for diabetes mellitus -     COMPLETE METABOLIC PANEL WITH GFR  Screening for lipid disorders -     Lipid Panel w/reflex Direct LDL  Encounter for hepatitis C screening test for low risk patient -     Hepatitis C Antibody  Pilar cyst   .. Discussed 150 minutes of exercise a week.  Encouraged vitamin D 1000 units and Calcium 1300mg  or 4 servings of dairy a day.  Fasting labs ordered.  Vaccines up to date.  Mammogram done.  Pap done.  Doing great with weight loss and exercises.   Reassured patient about pilar cyst. Will mychart her information.   See After Visit Summary for Counseling Recommendations

## 2020-03-09 LAB — LIPID PANEL W/REFLEX DIRECT LDL
Cholesterol: 217 mg/dL — ABNORMAL HIGH (ref <200)
HDL: 73 mg/dL (ref >=50)
LDL Cholesterol (Calc): 127 mg/dL (calc) — ABNORMAL HIGH
Non-HDL Cholesterol (Calc): 144 mg/dL (calc) — ABNORMAL HIGH (ref <130)
Total CHOL/HDL Ratio: 3 (calc) (ref <5.0)
Triglycerides: 76 mg/dL (ref <150)

## 2020-03-09 LAB — COMPLETE METABOLIC PANEL WITH GFR
AG Ratio: 2.1 (calc) (ref 1.0–2.5)
ALT: 19 U/L (ref 6–29)
AST: 18 U/L (ref 10–30)
Albumin: 4.8 g/dL (ref 3.6–5.1)
Alkaline phosphatase (APISO): 41 U/L (ref 31–125)
BUN: 19 mg/dL (ref 7–25)
CO2: 27 mmol/L (ref 20–32)
Calcium: 9.6 mg/dL (ref 8.6–10.2)
Chloride: 103 mmol/L (ref 98–110)
Creat: 0.8 mg/dL (ref 0.50–1.10)
GFR, Est African American: 105 mL/min/{1.73_m2} (ref >=60)
GFR, Est Non African American: 91 mL/min/{1.73_m2} (ref >=60)
Globulin: 2.3 g/dL (calc) (ref 1.9–3.7)
Glucose, Bld: 92 mg/dL (ref 65–99)
Potassium: 4.7 mmol/L (ref 3.5–5.3)
Sodium: 140 mmol/L (ref 135–146)
Total Bilirubin: 0.6 mg/dL (ref 0.2–1.2)
Total Protein: 7.1 g/dL (ref 6.1–8.1)

## 2020-03-09 LAB — HEPATITIS C ANTIBODY
Hepatitis C Ab: NONREACTIVE
SIGNAL TO CUT-OFF: 0.01 (ref <1.00)

## 2020-03-10 NOTE — Progress Notes (Signed)
Seraphim,   HDL is higher that is wonderful news. LDL about the same. Overall that is a really good cholesterol reading for you age and risk. Optimal would be  lDL under 100 but your HDL makes up for some of that. Kidney, liver, glucose look great. Hep C negative.   Great labs  -[employer]

## 2020-04-11 DIAGNOSIS — Z30431 Encounter for routine checking of intrauterine contraceptive device: Secondary | ICD-10-CM | POA: Diagnosis not present

## 2020-06-13 DIAGNOSIS — L821 Other seborrheic keratosis: Secondary | ICD-10-CM | POA: Diagnosis not present

## 2020-06-13 DIAGNOSIS — L910 Hypertrophic scar: Secondary | ICD-10-CM | POA: Diagnosis not present

## 2020-06-13 DIAGNOSIS — L814 Other melanin hyperpigmentation: Secondary | ICD-10-CM | POA: Diagnosis not present

## 2020-06-13 DIAGNOSIS — L579 Skin changes due to chronic exposure to nonionizing radiation, unspecified: Secondary | ICD-10-CM | POA: Diagnosis not present

## 2020-09-28 ENCOUNTER — Telehealth: Payer: BLUE CROSS/BLUE SHIELD | Admitting: Nurse Practitioner

## 2020-09-28 ENCOUNTER — Telehealth (INDEPENDENT_AMBULATORY_CARE_PROVIDER_SITE_OTHER): Payer: BLUE CROSS/BLUE SHIELD | Admitting: Nurse Practitioner

## 2020-09-28 ENCOUNTER — Encounter: Payer: Self-pay | Admitting: Nurse Practitioner

## 2020-09-28 ENCOUNTER — Telehealth: Payer: BLUE CROSS/BLUE SHIELD | Admitting: Family Medicine

## 2020-09-28 DIAGNOSIS — J0140 Acute pansinusitis, unspecified: Secondary | ICD-10-CM

## 2020-09-28 MED ORDER — FLUCONAZOLE 150 MG PO TABS: 150.0000 mg | ORAL_TABLET | Freq: Once | ORAL | 1 refills | Status: AC

## 2020-09-28 MED ORDER — AMOXICILLIN-POT CLAVULANATE 875-125 MG PO TABS: 1.0000 | ORAL_TABLET | Freq: Two times a day (BID) | ORAL | 0 refills | Status: DC

## 2020-09-28 NOTE — Patient Instructions (Signed)
   Adult Basic Symptom Management for Sinusitis  Congestion: Guaifenesin (Mucinex)- follow directions on packaging with a maximum dose of 2400mg  in a 24 hour period.  Pain/Fever: Ibuprofen 200mg  - 400mg  every 4-6 hours as needed (MAX 1200mg  in a 24 hour period) Pain/Fever: Tylenol 500mg  -1000mg  every 6-8 hours as needed (MAX 3000mg  in a 24 hour period)  Cough: Dextromethorphan (Delsym)- follow directions on packing with a maximum dose of 120mg  in a 24 hour period.  Nasal Stuffiness: Saline nasal spray and/or Nettie Pot with sterile saline solution  Runny Nose: Fluticasone nasal spray (Flonase) OR Mometasone nasal spray (Nasonex) OR Triamcinolone Acetonide nasal spray (Nasacort)- follow directions on the packaging  Pain/Pressure: Warm washcloth to the face  Sore Throat: Warm salt water gargles  If you have allergies, you may also consider taking an oral antihistamine (like Zyrtec or Claritin) as these may also help with your symptoms.  **Many medications will have more than one ingredient, be sure you are reading the packaging carefully and not taking more than one dose of the same kind of medication at the same time or too close together. It is OK to use formulas that have all of the ingredients you want, but do not take them in a combined medication and as separate dose too close together. If you have any questions, the pharmacist will be happy to help you decide what is safe.

## 2020-09-28 NOTE — Progress Notes (Signed)
Virtual Video Visit via MyChart Note  I connected with  Sandy Rodriguez on 09/28/20 at  1:30 PM EST by the video enabled telemedicine application for , MyChart, and verified that I am speaking with the correct person using two identifiers.   I introduced myself as a Publishing rights manager with the practice. We discussed the limitations of evaluation and management by telemedicine and the availability of in person appointments. The patient expressed understanding and agreed to proceed.  Participating parties in this visit include: The patient and nurse practitioner listed The patient is: at home I am: in the office  Subjective:    CC:  Chief Complaint  Patient presents with  . URI    HPI: Sandy Rodriguez is a 44 y.o. y/o female presenting via MyChart today for sinus symptoms.  Sx onset: Monday- Tuesday Endorsed sx: facial and sinus pressure and congestion, difficulty sleeping, rhinorrhea Negative sx: fever, cough, sore throat, body aches, chills, LoT, LoS Vaccinations: covid vaccinated Any testing performed: at home covid test yesterday results negative.   Past medical history, Surgical history, Family history not pertinant except as noted below, Social history, Allergies, and medications have been entered into the medical record, reviewed, and corrections made.   Review of Systems:  All review of systems negative except what is listed in the HPI   Objective:    General:  Speaking clearly in complete sentences. Absent shortness of breath noted.   Alert and oriented x3.   Normal judgment.  Absent acute distress.   Impression and Recommendations:    1. Acute non-recurrent pansinusitis Presentation consistent with acute sinusitis. Will begin antibiotic treatment due to severity of symptoms. COVID test was negative.  Diflucan provided for yeast infection patient typically gets with antibiotic use.  OTC recommendations for symptom management provided to patient.  -  amoxicillin-clavulanate (AUGMENTIN) 875-125 MG tablet; Take 1 tablet by mouth 2 (two) times daily.  Dispense: 10 tablet; Refill: 0 - fluconazole (DIFLUCAN) 150 MG tablet; Take 1 tablet (150 mg total) by mouth once for 1 dose. Repeat dose 72 hours if yeast infection persists  Dispense: 2 tablet; Refill: 1  Follow-up if symptoms worsen or fail to improve.    I discussed the assessment and treatment plan with the patient. The patient was provided an opportunity to ask questions and all were answered. The patient agreed with the plan and demonstrated an understanding of the instructions.   The patient was advised to call back or seek an in-person evaluation if the symptoms worsen or if the condition fails to improve as anticipated.  I provided 10 minutes of non-face-to-face interaction with this MYCHART visit including intake, same-day documentation, and chart review.   Tollie Eth, NP

## 2020-11-17 IMAGING — DX DG ANKLE COMPLETE 3+V*R*
3 series · 3 of 3 positions shown · non-contrast
Comparison: None.

CLINICAL DATA: Chronic ankle pain worsening over the past week.

EXAM:
RIGHT ANKLE - COMPLETE 3+ VIEW

[ankle ap]
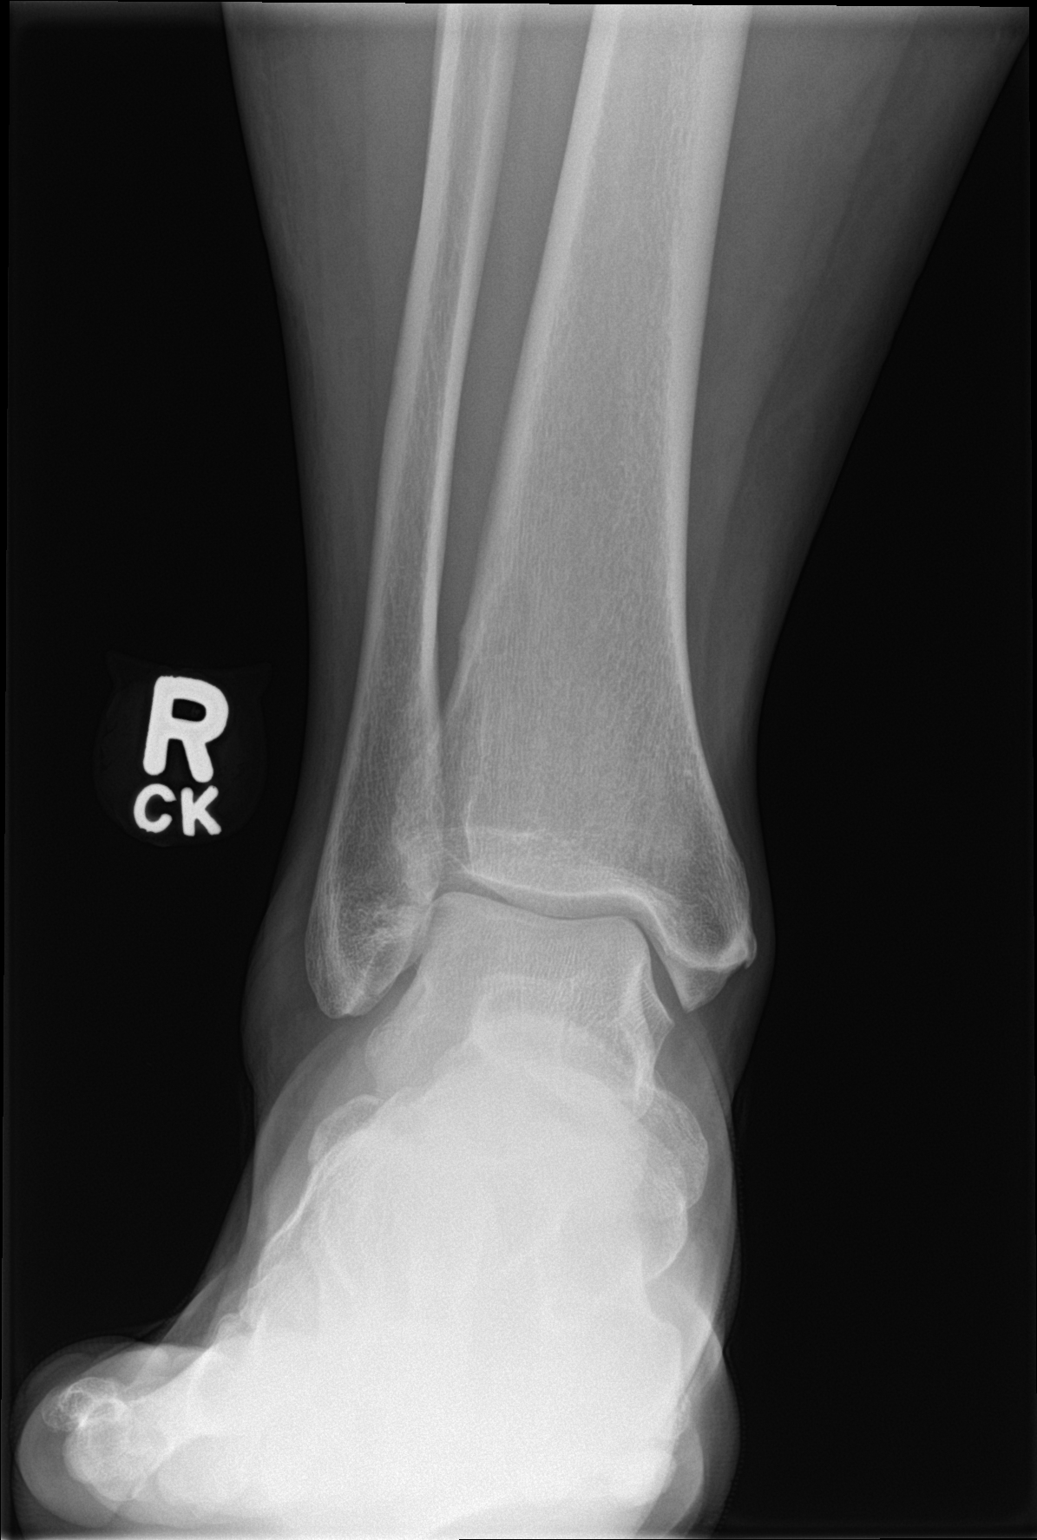

[ankle obl]
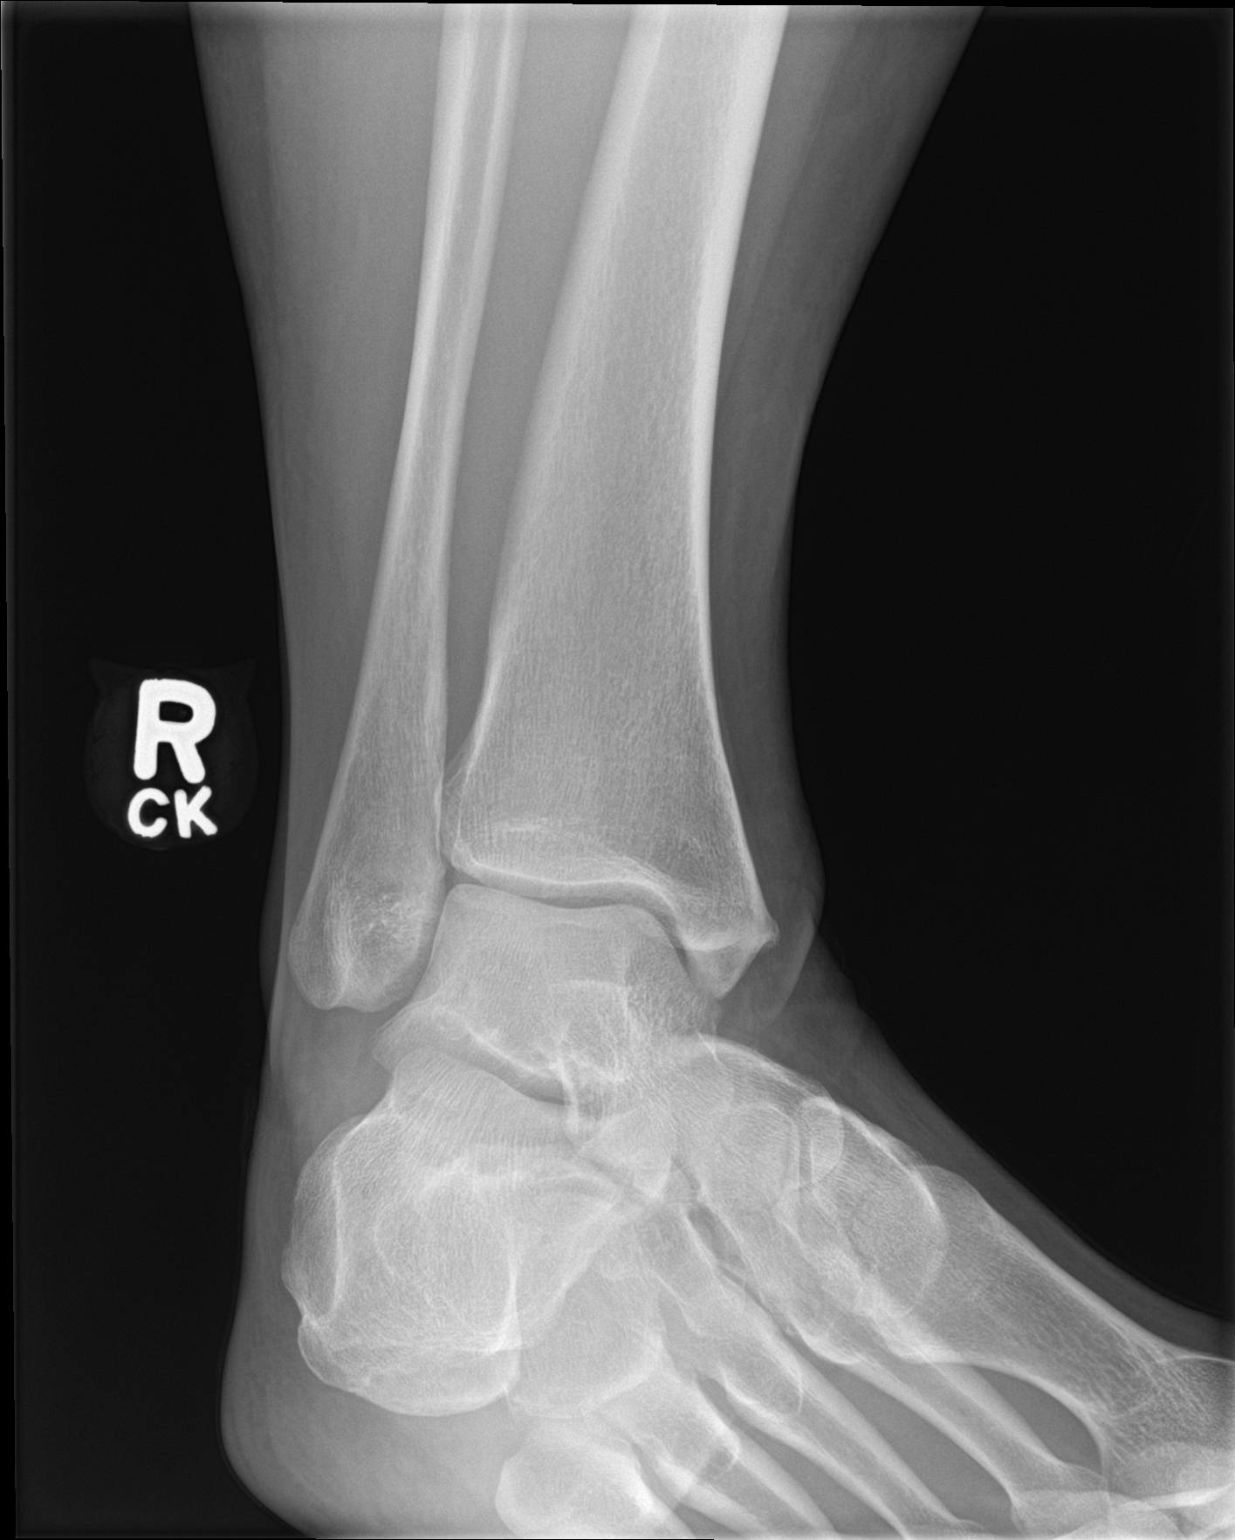

[ankle lat]
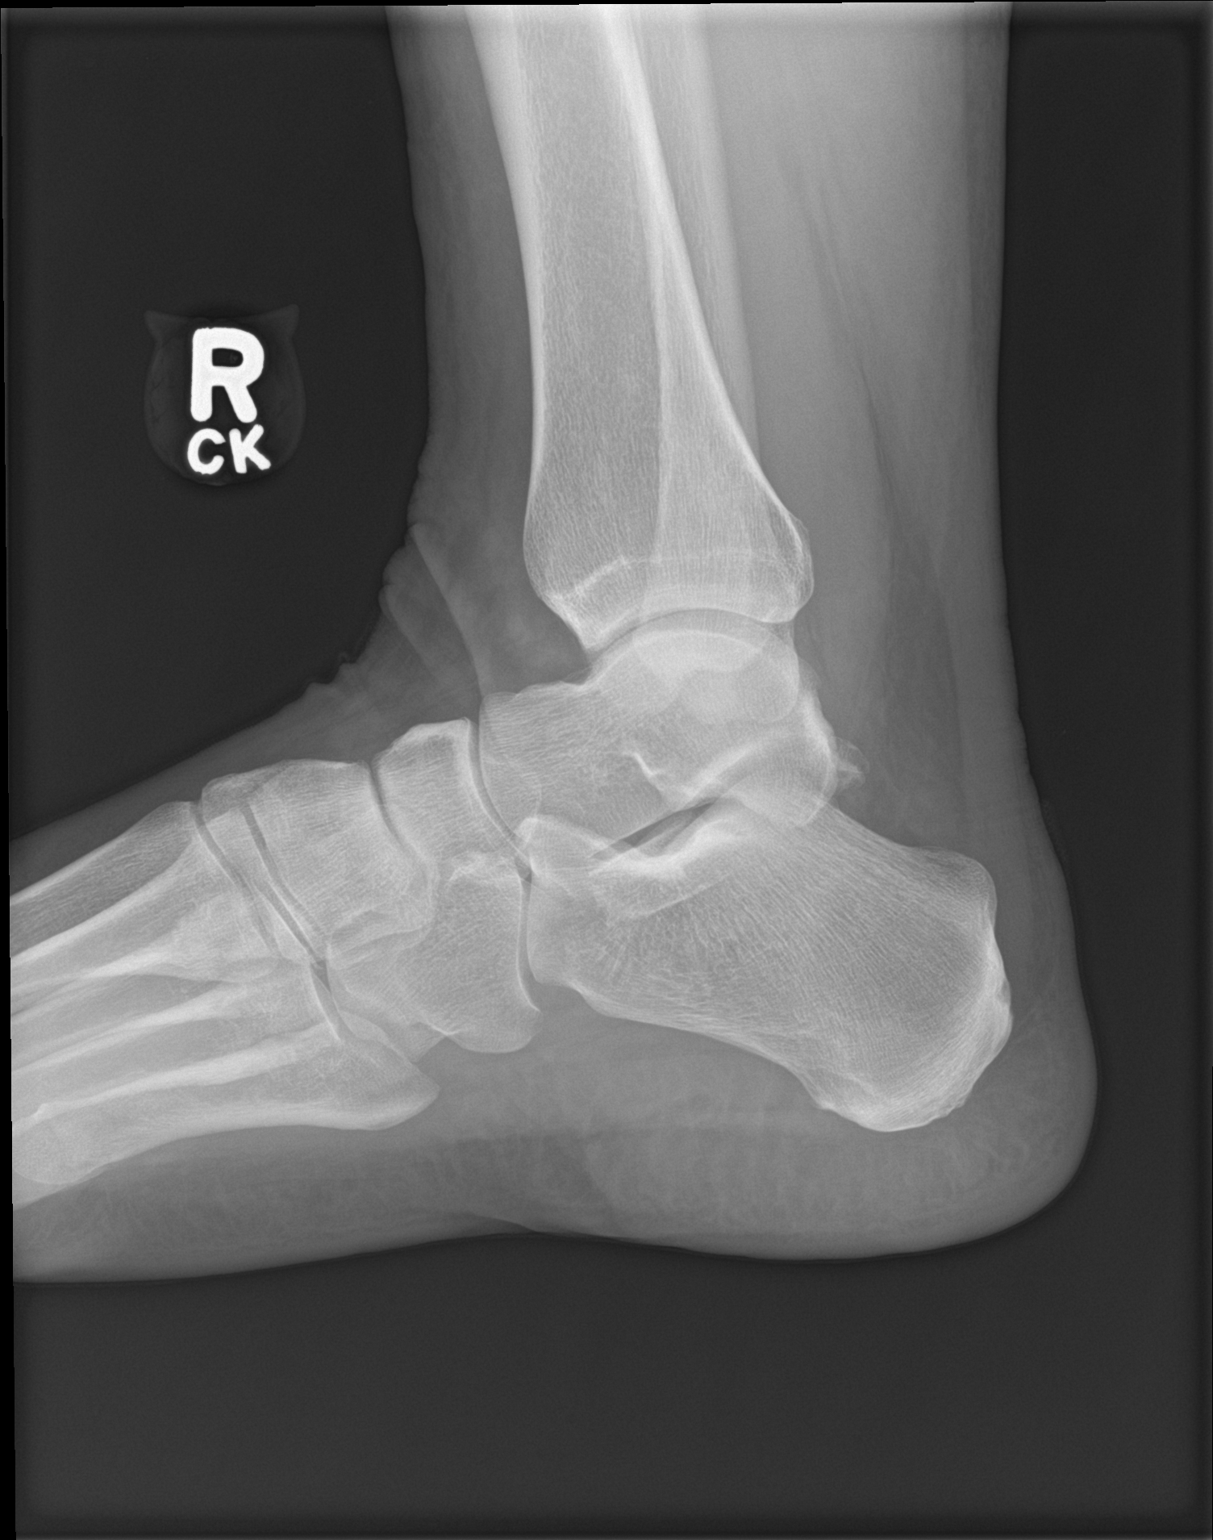

[3 of 3 positions shown; findings below may reference images not displayed]

FINDINGS: There is no evidence of fracture, dislocation, or joint effusion.
There is no evidence of arthropathy or other focal bone abnormality.
Soft tissue swelling over the lateral malleolus is noted. Minimal
enthesopathy off the medial malleolus. Intact midfoot articulations,
tibiotalar and subtalar joints. Tiny enthesophytes along the plantar
dorsal aspect of the calcaneus.
IMPRESSION: No acute osseous abnormality. Mild soft tissue swelling over lateral
malleolus. Tiny calcaneal enthesophytes.

## 2021-06-19 DIAGNOSIS — L814 Other melanin hyperpigmentation: Secondary | ICD-10-CM | POA: Diagnosis not present

## 2021-06-19 DIAGNOSIS — L579 Skin changes due to chronic exposure to nonionizing radiation, unspecified: Secondary | ICD-10-CM | POA: Diagnosis not present

## 2021-06-19 DIAGNOSIS — L821 Other seborrheic keratosis: Secondary | ICD-10-CM | POA: Diagnosis not present

## 2021-06-19 DIAGNOSIS — D225 Melanocytic nevi of trunk: Secondary | ICD-10-CM | POA: Diagnosis not present

## 2021-10-24 ENCOUNTER — Ambulatory Visit (INDEPENDENT_AMBULATORY_CARE_PROVIDER_SITE_OTHER): Payer: BLUE CROSS/BLUE SHIELD | Admitting: Physician Assistant

## 2021-10-24 ENCOUNTER — Encounter: Payer: Self-pay | Admitting: Physician Assistant

## 2021-10-24 ENCOUNTER — Other Ambulatory Visit: Payer: Self-pay

## 2021-10-24 VITALS — BP 142/92 | HR 86 | Temp 98.9°F | Ht 66.0 in | Wt 202.0 lb

## 2021-10-24 DIAGNOSIS — R058 Other specified cough: Secondary | ICD-10-CM

## 2021-10-24 MED ORDER — HYDROCOD POLI-CHLORPHE POLI ER 10-8 MG/5ML PO SUER: 5.0000 mL | Freq: Two times a day (BID) | ORAL | 0 refills | Status: DC | PRN

## 2021-10-24 MED ORDER — METHYLPREDNISOLONE SODIUM SUCC 125 MG IJ SOLR
125.0000 mg | Freq: Once | INTRAMUSCULAR | Status: AC
  Administered 2021-10-24: 15:00:00 125 mg via INTRAMUSCULAR

## 2021-10-24 NOTE — Progress Notes (Signed)
Subjective:     Patient ID: Sandy Rodriguez, female   DOB: 1975/12/02, 46 y.o.   MRN: [id number]  Cough Associated symptoms include a sore throat. Pertinent negatives include no chills, ear pain or fever.  46 YO female presents to the clinic complaining of a cough. She had a sore throat, nasal congestion, and cough for about a week. Her sore throat and nasal congestion have mostly resolved but her cough is producing some green sputum and lingering, making it difficult to sleep. She's used dayquil, advil, and tessalon pearls which have not helped. She denies any fevers, chills, or GI symptoms. Her daughters were also sick last week but their symptoms have mostly resolved.   Review of Systems  Constitutional:  Negative for chills and fever.  HENT:  Positive for sore throat. Negative for ear pain.   Respiratory:  Positive for cough.   Gastrointestinal:  Negative for abdominal pain, constipation, diarrhea, nausea and vomiting.  Psychiatric/Behavioral:  Positive for sleep disturbance.       Objective:   Physical Exam Constitutional:      General: She is not in acute distress.    Appearance: Normal appearance. She is obese. She is not ill-appearing, toxic-appearing or diaphoretic.  HENT:     Head: Normocephalic and atraumatic.     Right Ear: Tympanic membrane, ear canal and external ear normal.     Left Ear: Tympanic membrane, ear canal and external ear normal.     Nose: Nose normal. No congestion or rhinorrhea.     Mouth/Throat:     Mouth: Mucous membranes are moist.     Pharynx: No oropharyngeal exudate or posterior oropharyngeal erythema.  Eyes:     Conjunctiva/sclera: Conjunctivae normal.  Cardiovascular:     Rate and Rhythm: Normal rate and regular rhythm.     Pulses: Normal pulses.     Heart sounds: Normal heart sounds.  Pulmonary:     Effort: Pulmonary effort is normal.     Breath sounds: Normal breath sounds.  Skin:    General: Skin is warm and dry.  Neurological:     Mental  Status: She is alert and oriented to person, place, and time.  Psychiatric:        Mood and Affect: Mood normal.        Behavior: Behavior normal.        Thought Content: Thought content normal.        Judgment: Judgment normal.   .. .. Today's Vitals   10/24/21 1404 10/24/21 1419 10/24/21 1423  BP: (!) 156/104 (!) 144/87 (!) 142/92  Pulse: 86    Temp: 98.9 F (37.2 C)    TempSrc: Oral    SpO2: 99%    Weight: 202 lb (91.6 kg)    Height: 5\' 6"  (1.676 m)     Body mass index is 32.6 kg/m.     Assessment:     Marland Rodriguez was seen today for cough.  Diagnoses and all orders for this visit:  Post-viral cough syndrome -     chlorpheniramine-HYDROcodone 10-8 MG/5ML; Take 5 mLs by mouth every 12 (twelve) hours as needed for cough (cough, will cause drowsiness.). -     methylPREDNISolone sodium succinate (SOLU-MEDROL) 125 mg/2 mL injection 125 mg       Plan:     Start chlorphheniramine-hydrocodone to control cough at bedtime. Continue OtC options for cough during the day. Give solu-medrol IM for inflammation control.   Likely viral etiology, no fever or signs of infection that  warrant an antibiotic at this time.

## 2021-10-24 NOTE — Patient Instructions (Signed)

## 2022-02-22 ENCOUNTER — Encounter: Payer: Self-pay | Admitting: Neurology

## 2022-05-01 ENCOUNTER — Encounter: Payer: Self-pay | Admitting: Neurology

## 2022-09-27 ENCOUNTER — Encounter: Payer: Self-pay | Admitting: Family Medicine

## 2022-09-27 ENCOUNTER — Ambulatory Visit (INDEPENDENT_AMBULATORY_CARE_PROVIDER_SITE_OTHER): Payer: BC Managed Care – PPO | Admitting: Family Medicine

## 2022-09-27 VITALS — BP 121/88 | HR 112 | Temp 99.5°F | Ht 66.0 in | Wt 214.0 lb

## 2022-09-27 DIAGNOSIS — J069 Acute upper respiratory infection, unspecified: Secondary | ICD-10-CM

## 2022-09-27 DIAGNOSIS — R051 Acute cough: Secondary | ICD-10-CM

## 2022-09-27 MED ORDER — PROMETHAZINE-DM 6.25-15 MG/5ML PO SYRP: 5.0000 mL | ORAL_SOLUTION | Freq: Every evening | ORAL | 0 refills | Status: DC | PRN

## 2022-09-27 MED ORDER — FLUTICASONE PROPIONATE 50 MCG/ACT NA SUSP: 2.0000 | Freq: Every day | NASAL | 6 refills | Status: DC

## 2022-09-27 NOTE — Progress Notes (Signed)
Established Patient Office Visit  Subjective   Patient ID: Sandy Rodriguez, female    DOB: 1976/09/03  Age: 46 y.o. MRN: [id number]  Chief Complaint  Patient presents with   Acute Visit    Cough producing green nasal mucus, chest pain with cough, nasal congestion x Wednesday 09/25/22 ( around a week but slight cough longer) - home Covid test negative    HPI  Presents today for an acute visit with complaint of cough started before the holidays, nasal congestion and rib pain with coughing, green sputum production started on Wednesday.  Symptoms have been present for 2 days for  nasal congestion and cough started before holidays Associated symptoms include: no fever or chills, no shortness of breath.  Treatments tried include : cough suppressant during day, tylenol pm last night Treatment effective : tylenol pm helped her sleep last night Sick contacts : no Keeping fluids down, no nausea or vomiting.  Home Covid negative.   Review of Systems  Constitutional:  Negative for chills, fever and malaise/fatigue.  HENT:  Positive for congestion.   Respiratory:  Positive for cough. Negative for shortness of breath.        Rib pain with coughing.   Gastrointestinal:  Negative for nausea.      Objective:     BP 121/88   Pulse (!) 112   Temp 99.5 F (37.5 C)   Ht 5\' 6"  (1.676 m)   Wt 214 lb (97.1 kg)   SpO2 95%   BMI 34.54 kg/m    Physical Exam Vitals and nursing note reviewed.  Constitutional:      General: She is not in acute distress.    Appearance: Normal appearance.  HENT:     Right Ear: Tympanic membrane normal.     Left Ear: Tympanic membrane normal.     Nose: Congestion present.     Right Turbinates: Swollen.     Left Turbinates: Swollen.     Right Sinus: No maxillary sinus tenderness or frontal sinus tenderness.     Left Sinus: No maxillary sinus tenderness or frontal sinus tenderness.     Mouth/Throat:     Pharynx: Uvula midline. No pharyngeal swelling,  oropharyngeal exudate, posterior oropharyngeal erythema or uvula swelling.  Cardiovascular:     Rate and Rhythm: Tachycardia present.  Pulmonary:     Effort: Pulmonary effort is normal.     Breath sounds: Normal breath sounds.  Skin:    General: Skin is warm and dry.  Neurological:     General: No focal deficit present.     Mental Status: She is alert. Mental status is at baseline.      No results found for any visits on 09/27/22.    The 10-year ASCVD risk score (Arnett DK, et al., 2019) is: 0.5%    Assessment & Plan:   Problem List Items Addressed This Visit     Acute cough   Relevant Medications   promethazine-dextromethorphan (PROMETHAZINE-DM) 6.25-15 MG/5ML syrup   Viral upper respiratory tract infection - Primary   Relevant Medications   promethazine-dextromethorphan (PROMETHAZINE-DM) 6.25-15 MG/5ML syrup   fluticasone (FLONASE) 50 MCG/ACT nasal spray  URI symptoms present, no sinus pain or pressure, lungs clear, does not feel sick, able to complete her daily tasks.  No shortness of breath. Heart rate elevated today, will increase fluids. Cough is keeping her awake at night, wishes to have cough medicine prescribed today.  Promethazine DM at night for cough, understands to avoid driving while taking. Flonase  for nasal symptoms. Acetaminophen or ibuprofen as needed for body aches or fever.  Supportive and symptomatic treatment.   Return if symptoms worsen or fail to improve.    Chalmers Guest, FNP

## 2022-09-27 NOTE — Patient Instructions (Signed)
Viral illnesses can take up to 10 days to resolve.   There is no role for an antibiotic.  Symptomatic treatment is ideal.   Take over the counter pain medication as needed. Acetaminophen and ibuprofen for fever and body aches. Mucinex and Robitussin for cough, if you have high blood pressure, take Coricidin for cough. Honey is also effective for cough, avoid if diabetic. Flonase or saline nasal spray for nasal congestion.    Read and follow instructions on the label and make sure not to combine other medications that may have same ingredients in it. It is important to not take too much.    Drink plenty of caffeine-free fluids. (If you have heart or kidney problems, follow the instructions of your specialist regarding amounts). Adequate fluids will help you to avoid dehydration.   If you are hungry, eat a bland diet, such as the BRAT diet (bananas, rice, applesauce, toast). Diet as tolerated if appetite is normal.   Get lots of rest.  Let us know if you are not improving or getting worse.

## 2022-12-11 ENCOUNTER — Encounter: Payer: Self-pay | Admitting: Physician Assistant

## 2022-12-11 ENCOUNTER — Ambulatory Visit (INDEPENDENT_AMBULATORY_CARE_PROVIDER_SITE_OTHER): Payer: BC Managed Care – PPO | Admitting: Physician Assistant

## 2022-12-11 VITALS — BP 139/82 | HR 69 | Ht 66.0 in | Wt 216.0 lb

## 2022-12-11 DIAGNOSIS — Z131 Encounter for screening for diabetes mellitus: Secondary | ICD-10-CM | POA: Diagnosis not present

## 2022-12-11 DIAGNOSIS — Z0000 Encounter for general adult medical examination without abnormal findings: Secondary | ICD-10-CM | POA: Diagnosis not present

## 2022-12-11 DIAGNOSIS — Z1329 Encounter for screening for other suspected endocrine disorder: Secondary | ICD-10-CM | POA: Diagnosis not present

## 2022-12-11 DIAGNOSIS — Z13220 Encounter for screening for lipoid disorders: Secondary | ICD-10-CM

## 2022-12-11 DIAGNOSIS — Z1231 Encounter for screening mammogram for malignant neoplasm of breast: Secondary | ICD-10-CM

## 2022-12-11 DIAGNOSIS — Z1211 Encounter for screening for malignant neoplasm of colon: Secondary | ICD-10-CM

## 2022-12-11 DIAGNOSIS — E6609 Other obesity due to excess calories: Secondary | ICD-10-CM

## 2022-12-11 DIAGNOSIS — Z6834 Body mass index (BMI) 34.0-34.9, adult: Secondary | ICD-10-CM

## 2022-12-11 LAB — HM PAP SMEAR: HM Pap smear: NEGATIVE

## 2022-12-11 NOTE — Patient Instructions (Addendum)
Contrave(wellbutrin and naltrexone) Metformin Qsymia(topamax and phentermine) Wegovy zepbound  Health Maintenance, Female Adopting a healthy lifestyle and getting preventive care are important in promoting health and wellness. Ask your health care provider about: The right schedule for you to have regular tests and exams. Things you can do on your own to prevent diseases and keep yourself healthy. What should I know about diet, weight, and exercise? Eat a healthy diet  Eat a diet that includes plenty of vegetables, fruits, low-fat dairy products, and lean protein. Do not eat a lot of foods that are high in solid fats, added sugars, or sodium. Maintain a healthy weight Body mass index (BMI) is used to identify weight problems. It estimates body fat based on height and weight. Your health care provider can help determine your BMI and help you achieve or maintain a healthy weight. Get regular exercise Get regular exercise. This is one of the most important things you can do for your health. Most adults should: Exercise for at least 150 minutes each week. The exercise should increase your heart rate and make you sweat (moderate-intensity exercise). Do strengthening exercises at least twice a week. This is in addition to the moderate-intensity exercise. Spend less time sitting. Even light physical activity can be beneficial. Watch cholesterol and blood lipids Have your blood tested for lipids and cholesterol at 45 years of age, then have this test every 5 years. Have your cholesterol levels checked more often if: Your lipid or cholesterol levels are high. You are older than 45 years of age. You are at high risk for heart disease. What should I know about cancer screening? Depending on your health history and family history, you may need to have cancer screening at various ages. This may include screening for: Breast cancer. Cervical cancer. Colorectal cancer. Skin cancer. Lung  cancer. What should I know about heart disease, diabetes, and high blood pressure? Blood pressure and heart disease High blood pressure causes heart disease and increases the risk of stroke. This is more likely to develop in people who have high blood pressure readings or are overweight. Have your blood pressure checked: Every 3-5 years if you are 71-60 years of age. Every year if you are 83 years old or older. Diabetes Have regular diabetes screenings. This checks your fasting blood sugar level. Have the screening done: Once every three years after age 12 if you are at a normal weight and have a low risk for diabetes. More often and at a younger age if you are overweight or have a high risk for diabetes. What should I know about preventing infection? Hepatitis B If you have a higher risk for hepatitis B, you should be screened for this virus. Talk with your health care provider to find out if you are at risk for hepatitis B infection. Hepatitis C Testing is recommended for: Everyone born from [birth year] through 1963. Anyone with known risk factors for hepatitis C. Sexually transmitted infections (STIs) Get screened for STIs, including gonorrhea and chlamydia, if: You are sexually active and are younger than 45 years of age. You are older than 45 years of age and your health care provider tells you that you are at risk for this type of infection. Your sexual activity has changed since you were last screened, and you are at increased risk for chlamydia or gonorrhea. Ask your health care provider if you are at risk. Ask your health care provider about whether you are at high risk for HIV. Your health care  provider may recommend a prescription medicine to help prevent HIV infection. If you choose to take medicine to prevent HIV, you should first get tested for HIV. You should then be tested every 3 months for as long as you are taking the medicine. Pregnancy If you are about to stop having your  period (premenopausal) and you may become pregnant, seek counseling before you get pregnant. Take 400 to 800 micrograms (mcg) of folic acid every day if you become pregnant. Ask for birth control (contraception) if you want to prevent pregnancy. Osteoporosis and menopause Osteoporosis is a disease in which the bones lose minerals and strength with aging. This can result in bone fractures. If you are 58 years old or older, or if you are at risk for osteoporosis and fractures, ask your health care provider if you should: Be screened for bone loss. Take a calcium or vitamin D supplement to lower your risk of fractures. Be given hormone replacement therapy (HRT) to treat symptoms of menopause. Follow these instructions at home: Alcohol use Do not drink alcohol if: Your health care provider tells you not to drink. You are pregnant, may be pregnant, or are planning to become pregnant. If you drink alcohol: Limit how much you have to: 0-1 drink a day. Know how much alcohol is in your drink. In the [place] one drink equals one 12 oz bottle of beer (355 mL), one 5 oz glass of wine (148 mL), or one 1 oz glass of hard liquor (44 mL). Lifestyle Do not use any products that contain nicotine or tobacco. These products include cigarettes, chewing tobacco, and vaping devices, such as e-cigarettes. If you need help quitting, ask your health care provider. Do not use street drugs. Do not share needles. Ask your health care provider for help if you need support or information about quitting drugs. General instructions Schedule regular health, dental, and eye exams. Stay current with your vaccines. Tell your health care provider if: You often feel depressed. You have ever been abused or do not feel safe at home. Summary Adopting a healthy lifestyle and getting preventive care are important in promoting health and wellness. Follow your health care provider's instructions about healthy diet, exercising, and  getting tested or screened for diseases. Follow your health care provider's instructions on monitoring your cholesterol and blood pressure. This information is not intended to replace advice given to you by your health care provider. Make sure you discuss any questions you have with your health care provider. Document Revised: 02/05/2021 Document Reviewed: 02/05/2021 Elsevier Patient Education  [place].

## 2022-12-11 NOTE — Progress Notes (Signed)
Complete physical exam  Patient: Sandy Rodriguez   DOB: January 18, 1976   47 y.o. Female  MRN: [id number]  Subjective:    Chief Complaint  Patient presents with   Annual Exam    Sandy Rodriguez is a 47 y.o. female who presents today for a complete physical exam. She reports consuming a general diet.  Pt walks and tries to stay active but no regular exercise  She generally feels well. She reports sleeping well. She does not have additional problems to discuss today.    Most recent fall risk assessment:    12/11/2022    9:16 AM  Fall Risk   Falls in the past year? 0  Number falls in past yr: 0  Injury with Fall? 0  Risk for fall due to : No Fall Risks  Follow up Falls evaluation completed     Most recent depression screenings:    03/08/2020    8:33 AM 06/10/2018    8:17 AM  PHQ 2/9 Scores  PHQ - 2 Score 0 0  PHQ- 9 Score 1 3    Vision:Within last year and Dental: No current dental problems and Receives regular dental care  Patient Active Problem List   Diagnosis Date Noted   Acute cough 09/27/2022   Viral upper respiratory tract infection 09/27/2022   Pilar cyst 03/08/2020   Callus of heel 04/13/2019   Sinus tarsitis, right 12/04/2018   Retrocalcaneal bursitis, right 12/12/2017   Pain at the base of the right fifth metatarsal 12/12/2017   Ankle pain, left 12/12/2017   Gastrocnemius muscle strain, right, initial encounter 11/06/2017   Onychomycosis 11/20/2016   Allergic sinusitis 06/14/2015   Class 1 obesity due to excess calories without serious comorbidity with body mass index (BMI) of 33.0 to 33.9 in adult 06/14/2015   Abnormal weight gain 06/22/2014   CN (constipation) 06/22/2014   SKIN TAG 04/09/2010   No past medical history on file. Family History  Problem Relation Age of Onset   Hypertension Mother    Cancer Father        non hodgkins lymphoma   Stroke Paternal Aunt    Heart attack Maternal Grandfather    Stroke Paternal Grandmother    Allergies  Allergen  Reactions   Belviq [Lorcaserin Hcl]     Makes her feel weird.    Sulfonamide Derivatives       Patient Care Team: Lavada Mesi as PCP - General (Family Medicine)   Outpatient Medications Prior to Visit  Medication Sig   AMBULATORY NON FORMULARY MEDICATION LBS(lower bowel stimulator)   levonorgestrel (MIRENA, 52 MG,) 20 MCG/24HR IUD Mirena 20 mcg/24 hours (6 yrs) 52 mg intrauterine device  Take 1 device by intrauterine route.   magnesium oxide (MAG-OX) 400 (240 Mg) MG tablet Take 400 mg by mouth daily.   Multiple Vitamin (MULTIVITAMIN) tablet Take 1 tablet by mouth daily.   [DISCONTINUED] fluticasone (FLONASE) 50 MCG/ACT nasal spray Place 2 sprays into both nostrils daily.   [DISCONTINUED] promethazine-dextromethorphan (PROMETHAZINE-DM) 6.25-15 MG/5ML syrup Take 5 mLs by mouth at bedtime as needed for cough.   No facility-administered medications prior to visit.    Review of Systems  All other systems reviewed and are negative.         Objective:     BP 139/82   Pulse 69   Ht '5\' 6"'$  (1.676 m)   Wt 216 lb (98 kg)   SpO2 100%   BMI 34.86 kg/m  BP Readings from  Last 3 Encounters:  12/11/22 139/82  09/27/22 121/88  10/24/21 (!) 142/92   Wt Readings from Last 3 Encounters:  12/11/22 216 lb (98 kg)  09/27/22 214 lb (97.1 kg)  10/24/21 202 lb (91.6 kg)     Physical Exam  BP 139/82   Pulse 69   Ht '5\' 6"'$  (1.676 m)   Wt 216 lb (98 kg)   SpO2 100%   BMI 34.86 kg/m   General Appearance:    Alert, cooperative, obese, no distress, appears stated age  Head:    Normocephalic, without obvious abnormality, atraumatic  Eyes:    PERRL, conjunctiva/corneas clear, EOM's intact, fundi    benign, both eyes  Ears:    Normal TM's and external ear canals, both ears  Nose:   Nares normal, septum midline, mucosa normal, no drainage    or sinus tenderness  Throat:   Lips, mucosa, and tongue normal; teeth and gums normal  Neck:   Supple, symmetrical, trachea midline, no  adenopathy;    thyroid:  no enlargement/tenderness/nodules; no carotid   bruit or JVD  Back:     Symmetric, no curvature, ROM normal, no CVA tenderness  Lungs:     Clear to auscultation bilaterally, respirations unlabored  Chest Wall:    No tenderness or deformity   Heart:    Regular rate and rhythm, S1 and S2 normal, no murmur, rub   or gallop     Abdomen:     Soft, non-tender, bowel sounds active all four quadrants,    no masses, no organomegaly        Extremities:   Extremities normal, atraumatic, no cyanosis or edema  Pulses:   2+ and symmetric all extremities  Skin:   Skin color, texture, turgor normal, no rashes or lesions  Lymph nodes:   Cervical, supraclavicular, and axillary nodes normal  Neurologic:   CNII-XII intact, normal strength, sensation and reflexes    throughout        Assessment & Plan:    Routine Health Maintenance and Physical Exam  Immunization History  Administered Date(s) Administered   Influenza Split 09/12/2011, 08/04/2012   Influenza-Unspecified 08/30/2016   PFIZER(Purple Top)SARS-COV-2 Vaccination 12/29/2019, 01/19/2020   Tdap 10/01/2003, 06/22/2014    Health Maintenance  Topic Date Due   MAMMOGRAM  02/23/2021   COLONOSCOPY (Pts 45-60yr Insurance coverage will need to be confirmed)  Never done   PAP SMEAR-Modifier  01/07/2023   COVID-19 Vaccine (3 - 2023-24 season) 12/27/2022 (Originally 05/31/2022)   INFLUENZA VACCINE  12/29/2022 (Originally 04/30/2022)   HIV Screening  11/20/2026 (Originally 12/06/1992)   DTaP/Tdap/Td (3 - Td or Tdap) 06/22/2024   Hepatitis C Screening  Completed   HPV VACCINES  Aged Out    Discussed health benefits of physical activity, and encouraged her to engage in regular exercise appropriate for her age and condition.  .Marland KitchenArby Barrettewas seen today for annual exam.  Diagnoses and all orders for this visit:  Routine physical examination -     TSH -     Lipid Panel w/reflex Direct LDL -     COMPLETE METABOLIC PANEL WITH  GFR -     CBC with Differential/Platelet  Diabetes mellitus screening -     COMPLETE METABOLIC PANEL WITH GFR  Thyroid disorder screen -     TSH  Lipid screening -     Lipid Panel w/reflex Direct LDL  Encounter for screening mammogram for malignant neoplasm of breast -     MM 3D SCREENING MAMMOGRAM BILATERAL  BREAST  Colon cancer screening -     Ambulatory referral to Gastroenterology  Class 1 obesity due to excess calories without serious comorbidity with body mass index (BMI) of 34.0 to 34.9 in adult   .Marland Kitchen Discussed 150 minutes of exercise a week.  Encouraged vitamin D 1000 units and Calcium '1300mg'$  or 4 servings of dairy a day.  Fasting labs ordered PHQ no concerns Mammogram ordered today Pap will get results from GYN Colonoscopy ordered Vaccines UTD  .Marland KitchenDiscussed low carb diet with 1500 calories and 80g of protein.  Exercising at least 150 minutes a week.  [employer] could be a Microbiologist.  Discussed long list of medication options Pt will consider and let me know what she wants to try     Iran Planas, PA-C

## 2022-12-12 ENCOUNTER — Encounter: Payer: Self-pay | Admitting: Physician Assistant

## 2022-12-12 ENCOUNTER — Other Ambulatory Visit: Payer: Self-pay | Admitting: Physician Assistant

## 2022-12-12 ENCOUNTER — Encounter: Payer: Self-pay | Admitting: Gastroenterology

## 2022-12-12 DIAGNOSIS — Z1231 Encounter for screening mammogram for malignant neoplasm of breast: Secondary | ICD-10-CM

## 2022-12-12 LAB — LIPID PANEL W/REFLEX DIRECT LDL
Cholesterol: 232 mg/dL — ABNORMAL HIGH (ref <200)
HDL: 66 mg/dL (ref >=50)
LDL Cholesterol (Calc): 137 mg/dL (calc) — ABNORMAL HIGH
Non-HDL Cholesterol (Calc): 166 mg/dL (calc) — ABNORMAL HIGH (ref <130)
Total CHOL/HDL Ratio: 3.5 (calc) (ref <5.0)
Triglycerides: 154 mg/dL — ABNORMAL HIGH (ref <150)

## 2022-12-12 LAB — TSH: TSH: 1.39 mIU/L

## 2022-12-12 LAB — COMPLETE METABOLIC PANEL WITH GFR
AG Ratio: 1.8 (calc) (ref 1.0–2.5)
ALT: 22 U/L (ref 6–29)
AST: 22 U/L (ref 10–35)
Albumin: 4.6 g/dL (ref 3.6–5.1)
Alkaline phosphatase (APISO): 45 U/L (ref 31–125)
BUN: 13 mg/dL (ref 7–25)
CO2: 28 mmol/L (ref 20–32)
Calcium: 9.9 mg/dL (ref 8.6–10.2)
Chloride: 102 mmol/L (ref 98–110)
Creat: 0.74 mg/dL (ref 0.50–0.99)
Globulin: 2.5 g/dL (calc) (ref 1.9–3.7)
Glucose, Bld: 87 mg/dL (ref 65–99)
Potassium: 4.4 mmol/L (ref 3.5–5.3)
Sodium: 140 mmol/L (ref 135–146)
Total Bilirubin: 0.4 mg/dL (ref 0.2–1.2)
Total Protein: 7.1 g/dL (ref 6.1–8.1)
eGFR: 102 mL/min/{1.73_m2} (ref >=60)

## 2022-12-12 LAB — CBC WITH DIFFERENTIAL/PLATELET
Absolute Monocytes: 858 cells/uL (ref 200–950)
Basophils Absolute: 52 cells/uL (ref 0–200)
Basophils Relative: 0.4 %
Eosinophils Absolute: 195 cells/uL (ref 15–500)
Eosinophils Relative: 1.5 %
HCT: 44.1 % (ref 35.0–45.0)
Hemoglobin: 15 g/dL (ref 11.7–15.5)
Lymphs Abs: 3276 cells/uL (ref 850–3900)
MCH: 30.6 pg (ref 27.0–33.0)
MCHC: 34 g/dL (ref 32.0–36.0)
MCV: 90 fL (ref 80.0–100.0)
MPV: 9.2 fL (ref 7.5–12.5)
Monocytes Relative: 6.6 %
Neutro Abs: 8619 cells/uL — ABNORMAL HIGH (ref 1500–7800)
Neutrophils Relative %: 66.3 %
Platelets: 332 10*3/uL (ref 140–400)
RBC: 4.9 10*6/uL (ref 3.80–5.10)
RDW: 12.6 % (ref 11.0–15.0)
Total Lymphocyte: 25.2 %
WBC: 13 10*3/uL — ABNORMAL HIGH (ref 3.8–10.8)

## 2022-12-13 MED ORDER — NALTREXONE-BUPROPION HCL ER 8-90 MG PO TB12: ORAL_TABLET | ORAL | 0 refills | Status: DC

## 2022-12-13 MED ORDER — NALTREXONE-BUPROPION HCL ER 8-90 MG PO TB12: 2.0000 | ORAL_TABLET | Freq: Two times a day (BID) | ORAL | 1 refills | Status: DC

## 2022-12-13 NOTE — Progress Notes (Signed)
Sandy Rodriguez,   TG elevated.  LDL not quite to goal.   Overall 10 year risk is still low for CV event.  Continue to work on diet and exercise. Recheck in 1 year.   .The 10-year ASCVD risk score (Arnett DK, et al., 2019) is: 0.9%   Values used to calculate the score:     Age: 47 years     Sex: Female     Is Non-Hispanic African American: No     Diabetic: No     Tobacco smoker: No     Systolic Blood Pressure: [id number] mmHg     Is BP treated: No     HDL Cholesterol: 66 mg/dL     Total Cholesterol: 232 mg/dL  Thyroid looks good.  Kidney, liver, glucose looks good.  WBC elevated looks like fighting off infection? Are you feeling better?

## 2022-12-17 DIAGNOSIS — Z1231 Encounter for screening mammogram for malignant neoplasm of breast: Secondary | ICD-10-CM

## 2022-12-18 ENCOUNTER — Encounter: Payer: Self-pay | Admitting: Physician Assistant

## 2022-12-20 ENCOUNTER — Ambulatory Visit (INDEPENDENT_AMBULATORY_CARE_PROVIDER_SITE_OTHER): Payer: BC Managed Care – PPO

## 2022-12-20 ENCOUNTER — Other Ambulatory Visit (INDEPENDENT_AMBULATORY_CARE_PROVIDER_SITE_OTHER): Payer: BC Managed Care – PPO

## 2022-12-20 ENCOUNTER — Ambulatory Visit (INDEPENDENT_AMBULATORY_CARE_PROVIDER_SITE_OTHER): Payer: BC Managed Care – PPO | Admitting: Sports Medicine

## 2022-12-20 DIAGNOSIS — M5416 Radiculopathy, lumbar region: Secondary | ICD-10-CM

## 2022-12-20 DIAGNOSIS — M7751 Other enthesopathy of right foot: Secondary | ICD-10-CM

## 2022-12-20 DIAGNOSIS — M4316 Spondylolisthesis, lumbar region: Secondary | ICD-10-CM | POA: Diagnosis not present

## 2022-12-20 MED ORDER — TRIAMCINOLONE ACETONIDE 40 MG/ML IJ SUSP
40.0000 mg | Freq: Once | INTRAMUSCULAR | Status: AC
  Administered 2022-12-20: 40 mg via INTRAMUSCULAR

## 2022-12-20 NOTE — Assessment & Plan Note (Signed)
Recurrence of retrocalcaneal bursitis pain, we have done an aspiration and injection in the past back in 2020, crystal analysis was negative, she did really well and wore a boot after the injection, now with recurrence of pain for years later repeat retrocalcaneal bursa injection, boot for a week and then return to see me as needed.

## 2022-12-20 NOTE — Assessment & Plan Note (Signed)
No back pain having increasing numbness and tingling left lower leg rating to the outside of the left foot. Suspect radiculitis, adding home physical therapy, x-rays. Return to see me in 6 weeks for this, MR for interventional planning if no better.

## 2022-12-20 NOTE — Progress Notes (Signed)
    Procedures performed today:    Procedure: Real-time Ultrasound Guided injection of the right retrocalcaneal bursa Device: Samsung HS60  Verbal informed consent obtained.  Time-out conducted.  Noted no overlying erythema, induration, or other signs of local infection.  Skin prepped in a sterile fashion.  Local anesthesia: Topical Ethyl chloride.  With sterile technique and under real time ultrasound guidance: Taking care to avoid intra Achilles injection I injected 1 cc kenalog 40, 1 cc lidocaine into the retrocalcaneal bursa.   Completed without difficulty  Advised to call if fevers/chills, erythema, induration, drainage, or persistent bleeding.  Images permanently stored and available for review in PACS.  Impression: Technically successful ultrasound guided injection.  Independent interpretation of notes and tests performed by another provider:   None.  Brief History, Exam, Impression, and Recommendations:    Retrocalcaneal bursitis, right Recurrence of retrocalcaneal bursitis pain, we have done an aspiration and injection in the past back in 2020, crystal analysis was negative, she did really well and wore a boot after the injection, now with recurrence of pain for years later repeat retrocalcaneal bursa injection, boot for a week and then return to see me as needed.  Left lumbar radiculitis No back pain having increasing numbness and tingling left lower leg rating to the outside of the left foot. Suspect radiculitis, adding home physical therapy, x-rays. Return to see me in 6 weeks for this, MR for interventional planning if no better.    ____________________________________________ Gwen Her. Dianah Field, M.D., ABFM., CAQSM., AME. Primary Care and Sports Medicine [facility]  Adjunct Professor of [employer]  Risk manager

## 2023-01-03 NOTE — Therapy (Unsigned)
OUTPATIENT PHYSICAL THERAPY THORACOLUMBAR EVALUATION   Patient Name: Sandy Rodriguez D Harrow MRN: [id number] DOB:10/29/1975, 47 y.o., female Today's Date: 01/06/2023  END OF SESSION:  PT End of Session - 01/06/23 1254     Visit Number 1    Number of Visits 16    Date for PT Re-Evaluation 03/03/23    Authorization Type BCBS copay - none    Authorization Time Period year    Authorization - Visit Number 1    Authorization - Number of Visits 30    PT Start Time 0803    PT Stop Time 0845    PT Time Calculation (min) 42 min    Activity Tolerance Patient tolerated treatment well             History reviewed. No pertinent past medical history. Past Surgical History:  Procedure Laterality Date   BREAST REDUCTION SURGERY  1998   CHOLECYSTECTOMY     Patient Active Problem List   Diagnosis Date Noted   Left lumbar radiculitis 12/20/2022   Acute cough 09/27/2022   Viral upper respiratory tract infection 09/27/2022   Pilar cyst 03/08/2020   Callus of heel 04/13/2019   Sinus tarsitis, right 12/04/2018   Retrocalcaneal bursitis, right 12/12/2017   Pain at the base of the right fifth metatarsal 12/12/2017   Ankle pain, left 12/12/2017   Gastrocnemius muscle strain, right, initial encounter 11/06/2017   Onychomycosis 11/20/2016   Allergic sinusitis 06/14/2015   Class 1 obesity due to excess calories without serious comorbidity with body mass index (BMI) of 33.0 to 33.9 in adult 06/14/2015   Abnormal weight gain 06/22/2014   CN (constipation) 06/22/2014   SKIN TAG 04/09/2010    PCP: Tandy GawJade Breeback, PA-C  REFERRING PROVIDER: Dr Rodney Langtonhomas Thekkekandam  REFERRING DIAG: lumbar radiculitis   Rationale for Evaluation and Treatment: Rehabilitation  THERAPY DIAG:  Numbness of left foot - Plan: PT plan of care cert/re-cert  Other symptoms and signs involving cognitive functions following unspecified cerebrovascular disease - Plan: PT plan of care cert/re-cert  Abnormal posture - Plan: PT plan  of care cert/re-cert  ONSET DATE: 11/24/22  SUBJECTIVE:                                                                                                                                                                                           SUBJECTIVE STATEMENT: Patient reports onset of some numbness in the ball of the Lt foot and decreased sensation in the inside of the Lt leg   PERTINENT HISTORY:  Unremarkable per pt report   PAIN:  Are you having pain? Yes: NPRS scale: 0/10 Pain location: none Pain  description: none Aggravating factors: none Relieving factors: none  PRECAUTIONS: None  WEIGHT BEARING RESTRICTIONS: No  FALLS:  Has patient fallen in last 6 months? 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   PATIENT SURVEYS:  FOTO 51 goal 7485  SCREENING FOR RED FLAGS: Bowel or bladder incontinence: No Spinal tumors: No Cauda equina syndrome: No Compression fracture: No Abdominal aneurysm: No  COGNITION: Overall cognitive status: Within functional limits for tasks assessed     SENSATION: WFL  MUSCLE LENGTH: Hamstrings: Right 75 deg; Left 75 deg  POSTURE: rounded shoulders, forward head, and flexed trunk   PALPATION: Muscular tightness bilat hip flexors;   LUMBAR ROM:   AROM eval  Flexion WFL's  Extension WFL's  Right lateral flexion WFL's  Left lateral flexion WFL's  Right rotation WFL's  Left rotation WFL's   (Blank rows = not tested)  LOWER EXTREMITY ROM:   WNL's  throughout   Active  Right eval Left eval  Hip flexion    Hip extension    Hip abduction    Hip adduction    Hip internal rotation    Hip external rotation    Knee flexion    Knee extension    Ankle dorsiflexion    Ankle plantarflexion    Ankle inversion    Ankle eversion     (Blank rows = not tested)  LOWER EXTREMITY MMT:  WNL's throughout   MMT Right eval Left eval  Hip flexion    Hip extension    Hip abduction    Hip adduction    Hip internal rotation    Hip external rotation    Knee flexion    Knee extension    Ankle dorsiflexion    Ankle plantarflexion    Ankle inversion    Ankle eversion     (Blank rows = not tested)  LUMBAR SPECIAL TESTS:  Straight leg raise test: Negative and Slump test: Negative  BALANCE:  WNL's SLS bilat 10 sec bilat    GAIT: Distance walked: 40 Assistive device utilized: None Level of assistance: Complete Independence Comments: WNL's   FOTO: 51; goal 85  [facility] Adult PT Treatment:                                                DATE: 01/06/23 Therapeutic Exercise: Prone press up 2-3 sec x 10 Toe yoga x 10  Towel scrunch x 1 min  Ankle circles CW/CCW Gastroc stretch 30 sec x 2  Soleus stretch 30 sec x 2  Manual Therapy:     PATIENT EDUCATION:  Education details: POC; HEP Person educated: Patient Education method: Programmer, multimediaxplanation, Demonstration, ActorTactile cues, Verbal cues, and Handouts Education comprehension: verbalized understanding, returned demonstration, verbal cues required, tactile cues required, and needs further education  HOME EXERCISE PROGRAM: Access Code: [email or web address] URL: [email or web address] Date: 01/06/2023 Prepared by: Corlis Leakelyn   Exercises - Prone Press Up  - 2 x daily - 7 x weekly - 1 sets - 10 reps - 2-3 sec  hold - Toe Yoga - Alternating Great Toe and Lesser Toe Extension  - 2 x daily - 7 x weekly - 1 sets - 10 reps - 3 sec  hold - Seated Toe Towel Scrunches  - 2 x daily - 7 x weekly -  Ankle Circles in Elevation  - 2 x daily - 7 x weekly - 1 sets - 10-15 reps - Gastroc Stretch on Wall  - 2 x daily - 7 x weekly - 1 sets - 3 reps - 30 sec  hold - Standing Gastroc Stretch  - 2 x daily - 7 x weekly - 1 sets - 3 reps - 30 sec  hold  Patient Education - Trigger Point Dry Needling  ASSESSMENT:  CLINICAL IMPRESSION: Patient is a 47 y.o. female who was seen today for physical therapy evaluation and treatment for lumbar radiculopathy. She presents with numbness in the ball of the Lt foot and tenderness in the medial leg along the tibia. Strength and ROM in lumbar spine and LE's is WNL's throughout. Patient has tenderness to palpation through the medial Lt leg along the tibia with palpable trigger points. She will benefit from PT to address problems identified.   OBJECTIVE IMPAIRMENTS: decreased activity tolerance, decreased ROM, decreased strength, hypomobility, increased fascial restrictions, increased muscle spasms, impaired flexibility, improper body mechanics, postural dysfunction, and pain.   ACTIVITY LIMITATIONS: none  PARTICIPATION LIMITATIONS:  no limits   PERSONAL FACTORS: Fitness and Past/current experiences are also affecting patient's functional outcome.   REHAB POTENTIAL: Good  CLINICAL DECISION MAKING: Stable/uncomplicated  EVALUATION COMPLEXITY: Low   GOALS: Goals reviewed with patient? Yes  SHORT TERM GOALS: Target date: 02/03/2023    Independent in initial HEP  Baseline: Goal status: INITIAL  2.  Decreased palpable tightness to palpation medial Lt leg  Baseline:  Goal status: INITIAL   LONG TERM GOALS: Target date: 03/03/2023   Decrease sensation of numbness in Lt foot by 75-100%  Baseline:  Goal status: INITIAL  2.  Improve tolerance of walking without sensation of towel bunching at plantar surface  Baseline:  Goal status: INITIAL  3.  Resolve radicular Lt LE symptoms  Baseline:  Goal status: INITIAL  4.  Independent in HEP  Baseline:   Goal status: INITIAL  5. Improve functional limitation score to 85  Baseline: 51  Goal status: INITIAL  PLAN:  PT FREQUENCY: 2x/week  PT DURATION: 8 weeks  PLANNED INTERVENTIONS: Therapeutic exercises, Therapeutic activity, Neuromuscular re-education, Balance training, Gait training, Patient/Family education, Self Care, Joint mobilization, Aquatic Therapy, Dry Needling, Electrical stimulation, Spinal mobilization, Cryotherapy, Moist heat, Taping, Traction, Ultrasound, Ionotophoresis 4mg /ml Dexamethasone, Manual therapy, and Re-evaluation.  PLAN FOR NEXT SESSION: review and progress with exercises; postural correction; manual, DN, modalities as indicated     Rober Minion, PT 01/06/2023, 1:02 PM

## 2023-01-06 ENCOUNTER — Ambulatory Visit: Payer: BC Managed Care – PPO | Attending: Sports Medicine | Admitting: Rehabilitative and Restorative Service Providers"

## 2023-01-06 ENCOUNTER — Encounter: Payer: Self-pay | Admitting: Rehabilitative and Restorative Service Providers"

## 2023-01-06 ENCOUNTER — Other Ambulatory Visit: Payer: Self-pay

## 2023-01-06 DIAGNOSIS — R200 Anesthesia of skin: Secondary | ICD-10-CM | POA: Insufficient documentation

## 2023-01-06 DIAGNOSIS — I69918 Other symptoms and signs involving cognitive functions following unspecified cerebrovascular disease: Secondary | ICD-10-CM | POA: Insufficient documentation

## 2023-01-06 DIAGNOSIS — M5416 Radiculopathy, lumbar region: Secondary | ICD-10-CM | POA: Insufficient documentation

## 2023-01-06 DIAGNOSIS — R293 Abnormal posture: Secondary | ICD-10-CM | POA: Diagnosis present

## 2023-01-13 ENCOUNTER — Encounter: Payer: Self-pay | Admitting: Physician Assistant

## 2023-01-13 ENCOUNTER — Encounter: Payer: Self-pay | Admitting: Rehabilitative and Restorative Service Providers"

## 2023-01-13 ENCOUNTER — Encounter: Payer: Self-pay | Admitting: Gastroenterology

## 2023-01-13 ENCOUNTER — Ambulatory Visit (AMBULATORY_SURGERY_CENTER): Payer: BC Managed Care – PPO | Admitting: *Deleted

## 2023-01-13 ENCOUNTER — Ambulatory Visit: Payer: BC Managed Care – PPO | Admitting: Rehabilitative and Restorative Service Providers"

## 2023-01-13 VITALS — Ht 66.0 in | Wt 200.0 lb

## 2023-01-13 DIAGNOSIS — M5416 Radiculopathy, lumbar region: Secondary | ICD-10-CM | POA: Diagnosis not present

## 2023-01-13 DIAGNOSIS — I69918 Other symptoms and signs involving cognitive functions following unspecified cerebrovascular disease: Secondary | ICD-10-CM

## 2023-01-13 DIAGNOSIS — R200 Anesthesia of skin: Secondary | ICD-10-CM | POA: Diagnosis not present

## 2023-01-13 DIAGNOSIS — R293 Abnormal posture: Secondary | ICD-10-CM

## 2023-01-13 DIAGNOSIS — Z1211 Encounter for screening for malignant neoplasm of colon: Secondary | ICD-10-CM

## 2023-01-13 MED ORDER — NA SULFATE-K SULFATE-MG SULF 17.5-3.13-1.6 GM/177ML PO SOLN
1.0000 | Freq: Once | ORAL | 0 refills | Status: AC
Start: 2023-01-13 — End: 2023-01-13

## 2023-01-13 NOTE — Therapy (Signed)
OUTPATIENT PHYSICAL THERAPY THORACOLUMBAR TREATMENT    Patient Name: Sandy Rodriguez MRN: [id number] DOB:[birth year], 47 y.o., female Today's Date: 01/13/2023  END OF SESSION:  PT End of Session - 01/13/23 1405     Visit Number 2    Number of Visits 16    Date for PT Re-Evaluation 03/03/23    Authorization Type [employer]BS copay - none    Authorization Time Period year    Authorization - Visit Number 2    Authorization - Number of Visits 30    PT Start Time 1403    PT Stop Time 1445    PT Time Calculation (min) 42 min    Activity Tolerance Patient tolerated treatment well             History reviewed. No pertinent past medical history. Past Surgical History:  Procedure Laterality Date   BREAST REDUCTION SURGERY  1998   CHOLECYSTECTOMY     Patient Active Problem List   Diagnosis Date Noted   Left lumbar radiculitis 12/20/2022   Acute cough 09/27/2022   Viral upper respiratory tract infection 09/27/2022   Pilar cyst 03/08/2020   Callus of heel 04/13/2019   Sinus tarsitis, right 12/04/2018   Retrocalcaneal bursitis, right 12/12/2017   Pain at the base of the right fifth metatarsal 12/12/2017   Ankle pain, left 12/12/2017   Gastrocnemius muscle strain, right, initial encounter 11/06/2017   Onychomycosis 11/20/2016   Allergic sinusitis 06/14/2015   Class 1 obesity due to excess calories without serious comorbidity with body mass index (BMI) of 33.0 to 33.9 in adult 06/14/2015   Abnormal weight gain 06/22/2014   CN (constipation) 06/22/2014   SKIN TAG 04/09/2010    PCP: Tandy Gaw, PA-C  REFERRING PROVIDER: Dr Rodney Langton  REFERRING DIAG: lumbar radiculitis   Rationale for Evaluation and Treatment: Rehabilitation  THERAPY DIAG:  Numbness of left foot  Other symptoms and signs involving cognitive functions following unspecified cerebrovascular disease  Abnormal posture  ONSET DATE: 11/24/22  SUBJECTIVE:                                                                                                                                                                                            SUBJECTIVE STATEMENT: Patient reports compliance with HEP and feel the numbness is now more isolated to the ball of the Lt foot and she is not noticing it in the Lt leg   EVAL: Patient reports onset of some numbness in the ball of the Lt foot and decreased sensation in the inside of the Lt leg the end of February   PERTINENT HISTORY:  Unremarkable per pt  report   PAIN:  Are you having pain? Yes: NPRS scale: 0/10 Pain location: none Pain description: none Aggravating factors: none Relieving factors: none  PRECAUTIONS: None  WEIGHT BEARING RESTRICTIONS: No  FALLS:  Has patient fallen in last 6 months? 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   PATIENT SURVEYS:  FOTO 51 goal 52   SENSATION: WFL except numbness in the ball of the Lt foot   MUSCLE LENGTH: Hamstrings: Right 75 deg; Left 75 deg  POSTURE: rounded shoulders, forward head, and flexed trunk   PALPATION: Muscular tightness bilat hip flexors;   LUMBAR ROM:   AROM eval  Flexion WFL's  Extension WFL's  Right lateral flexion WFL's  Left lateral flexion WFL's  Right rotation WFL's  Left rotation WFL's   (Blank rows = not tested)  LOWER EXTREMITY ROM:   WNL's throughout   Active  Right eval Left eval  Hip flexion    Hip extension    Hip abduction    Hip adduction    Hip internal rotation    Hip  external rotation    Knee flexion    Knee extension    Ankle dorsiflexion    Ankle plantarflexion    Ankle inversion    Ankle eversion     (Blank rows = not tested)  LOWER EXTREMITY MMT:  WNL's throughout   MMT Right eval Left eval  Hip flexion    Hip extension    Hip abduction    Hip adduction    Hip internal rotation    Hip external rotation    Knee flexion    Knee extension    Ankle dorsiflexion    Ankle plantarflexion    Ankle inversion    Ankle eversion     (Blank rows = not tested)  LUMBAR SPECIAL TESTS:  Straight leg raise test: Negative and Slump test: Negative  BALANCE:  WNL's SLS bilat 10 sec bilat    GAIT: Distance walked: 40 Assistive device utilized: None Level of assistance: Complete Independence Comments: WNL's   FOTO: 51; goal 85  [facility] Adult PT Treatment:                                                DATE: 01/13/23 Therapeutic Exercise: Prone press up 2-3 sec x 10 Glut set 10 sec x 10  Hip extension 2 sec x 10 Rt/Lt  LE/UE extension in prone 2 sec x 10 Rt/LE  Toe yoga x 10  Towel scrunch x 1 min  Marble pick up for HEP  Ankle circles CW/CCW Gastroc stretch 30 sec x 2  Soleus stretch 30 sec x 2  Manual Therapy: Skilled palpation to assess response to DN and manual work  STM Lt foot including mobilization of Engineer, drilling Dry-Needling  Treatment instructions: Expect mild to moderate muscle soreness. S/S of pneumothorax if  dry needled over a lung field, and to seek immediate medical attention should they occur. Patient verbalized understanding of these instructions and education.  Patient Consent Given: Yes Education handout provided: Previously provided Muscles treated: interossei Lt foot btn 2nd and 3rd and 1st and second metatarsals  Electrical stimulation performed: No Parameters: N/A Treatment response/outcome: decreased tightness and improved mobility through the metatarsals     [facility] Adult PT Treatment:                                                 DATE: 01/06/23 Therapeutic Exercise: Prone press up 2-3 sec x 10 Toe yoga x 10  Towel scrunch x 1 min  Ankle circles CW/CCW Gastroc stretch 30 sec x 2  Soleus stretch 30 sec x 2  Manual Therapy:     PATIENT EDUCATION:  Education details: POC; HEP Person educated: Patient Education method: Programmer, multimedia, Demonstration, Actor cues, Verbal cues, and Handouts Education comprehension: verbalized understanding, returned demonstration, verbal cues required, tactile cues required, and needs further education  HOME EXERCISE PROGRAM: Access Code: [email or web address] URL: [email or web address] Date: 01/13/2023 Prepared by: Corlis Leak  Exercises - Prone Press Up  - 2 x daily - 7 x weekly - 1 sets - 10 reps - 2-3 sec  hold - Toe Yoga - Alternating Great Toe and Lesser Toe Extension  - 2 x daily - 7 x weekly - 1 sets - 10 reps - 3 sec  hold - Seated Toe Towel Scrunches  - 2 x daily - 7 x weekly - Ankle Circles in Elevation  - 2 x daily - 7 x weekly - 1 sets - 10-15 reps - Gastroc Stretch on Wall  - 2 x daily - 7 x weekly - 1 sets - 3 reps - 30 sec  hold - Standing Gastroc Stretch  - 2 x daily - 7 x weekly - 1 sets - 3 reps - 30 sec  hold - Prone Alternating Arm and Leg Lifts  - 2 x daily - 7 x weekly - 1 sets - 10 reps - 2-3 sec  hold - Seated Marble Pick-Up with Toes  - 2 x daily - 7 x weekly  Patient Education - Trigger Point Dry Needling  ASSESSMENT:  CLINICAL IMPRESSION: Patient reports improvement in the numbness in the Lt lower leg but continued numbness ball of the Lt foot.  Trial of DN for interossei of Lt foot.  Added lumbar extension exercises and exercise for foot intrinsics.    OBJECTIVE IMPAIRMENTS: numbness in the ball of the Lt foot and tenderness in the medial leg along the tibia. Strength and ROM in lumbar spine and LE's is WNL's throughout. Patient has tenderness to palpation through the medial Lt leg along the tibia with palpable  trigger points; decreased activity tolerance, decreased ROM, decreased strength, hypomobility, increased fascial restrictions, increased muscle spasms, impaired flexibility, improper body mechanics, postural dysfunction, and pain.    GOALS: Goals reviewed with patient? Yes  SHORT TERM GOALS: Target date: 02/03/2023    Independent in initial HEP  Baseline: Goal status: INITIAL  2.  Decreased palpable tightness to palpation medial Lt leg  Baseline:  Goal status: INITIAL   LONG TERM GOALS: Target date: 03/03/2023   Decrease sensation of numbness in Lt foot by 75-100%  Baseline:  Goal status: INITIAL  2.  Improve tolerance of walking  without sensation of towel bunching at plantar surface  Baseline:  Goal status: INITIAL  3.  Resolve radicular Lt LE symptoms  Baseline:  Goal status: INITIAL  4.  Independent in HEP  Baseline:  Goal status: INITIAL  5. Improve functional limitation score to 85  Baseline: 51  Goal status: INITIAL  PLAN:  PT FREQUENCY: 2x/week  PT DURATION: 8 weeks  PLANNED INTERVENTIONS: Therapeutic exercises, Therapeutic activity, Neuromuscular re-education, Balance training, Gait training, Patient/Family education, Self Care, Joint mobilization, Aquatic Therapy, Dry Needling, Electrical stimulation, Spinal mobilization, Cryotherapy, Moist heat, Taping, Traction, Ultrasound, Ionotophoresis 4mg /ml Dexamethasone, Manual therapy, and Re-evaluation.  PLAN FOR NEXT SESSION: review and progress with exercises; postural correction; manual, DN, modalities as indicated     Rober Minion, PT 01/13/2023, 2:06 PM

## 2023-01-13 NOTE — Progress Notes (Signed)

## 2023-01-20 ENCOUNTER — Ambulatory Visit: Payer: BC Managed Care – PPO | Admitting: Rehabilitative and Restorative Service Providers"

## 2023-01-20 ENCOUNTER — Encounter: Payer: Self-pay | Admitting: Rehabilitative and Restorative Service Providers"

## 2023-01-20 DIAGNOSIS — R200 Anesthesia of skin: Secondary | ICD-10-CM | POA: Diagnosis not present

## 2023-01-20 DIAGNOSIS — M5416 Radiculopathy, lumbar region: Secondary | ICD-10-CM | POA: Diagnosis not present

## 2023-01-20 DIAGNOSIS — R293 Abnormal posture: Secondary | ICD-10-CM | POA: Diagnosis not present

## 2023-01-20 DIAGNOSIS — I69918 Other symptoms and signs involving cognitive functions following unspecified cerebrovascular disease: Secondary | ICD-10-CM | POA: Diagnosis not present

## 2023-01-20 NOTE — Therapy (Signed)
OUTPATIENT PHYSICAL THERAPY THORACOLUMBAR TREATMENT    Patient Name: Sandy Rodriguez MRN: [id number] DOB:[birth year], 47 y.o., female Today's Date: 01/20/2023  END OF SESSION:  PT End of Session - 01/20/23 0848     Visit Number 3    Number of Visits 16    Date for PT Re-Evaluation 03/03/23    Authorization Type [employer] copay - none    Authorization Time Period year    Authorization - Visit Number 3    Authorization - Number of Visits 30    PT Start Time [phone]    PT Stop Time 0930    PT Time Calculation (min) 44 min    Activity Tolerance Patient tolerated treatment well             History reviewed. No pertinent past medical history. Past Surgical History:  Procedure Laterality Date   BREAST REDUCTION SURGERY  1998   CHOLECYSTECTOMY     Patient Active Problem List   Diagnosis Date Noted   Left lumbar radiculitis 12/20/2022   Acute cough 09/27/2022   Viral upper respiratory tract infection 09/27/2022   Pilar cyst 03/08/2020   Callus of heel 04/13/2019   Sinus tarsitis, right 12/04/2018   Retrocalcaneal bursitis, right 12/12/2017   Pain at the base of the right fifth metatarsal 12/12/2017   Ankle pain, left 12/12/2017   Gastrocnemius muscle strain, right, initial encounter 11/06/2017   Onychomycosis 11/20/2016   Allergic sinusitis 06/14/2015   Class 1 obesity due to excess calories without serious comorbidity with body mass index (BMI) of 33.0 to 33.9 in adult 06/14/2015   Abnormal weight gain 06/22/2014   CN (constipation) 06/22/2014   SKIN TAG 04/09/2010    PCP: Tandy Gaw, PA-C  REFERRING PROVIDER: Dr Rodney Langton  REFERRING DIAG: lumbar radiculitis   Rationale for Evaluation and Treatment: Rehabilitation  THERAPY DIAG:  Numbness of left foot  Other symptoms and signs involving cognitive functions following unspecified cerebrovascular disease  Abnormal posture  ONSET DATE: 11/24/22  SUBJECTIVE:                                                                                                                                                                                            SUBJECTIVE STATEMENT: Patient reports compliance with HEP and feel the numbness is now more isolated to bottom of foot towards the big toe and less in the ball of the Lt foot and she is still not noticing numbness in the Lt leg   EVAL: Patient reports onset of some numbness in the ball of the Lt foot and decreased sensation in the inside of the Lt leg the  end of February   PERTINENT HISTORY:  Unremarkable per pt report   PAIN:  Are you having pain? Yes: NPRS scale: 0/10 Pain location: none Pain description: none Aggravating factors: none Relieving factors: none  PRECAUTIONS: None  WEIGHT BEARING RESTRICTIONS: No  FALLS:  Has patient fallen in last 6 months? 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   PATIENT SURVEYS:  FOTO 51 goal 41   SENSATION: WFL except numbness in the ball of the Lt foot   MUSCLE LENGTH: Hamstrings: Right 75 deg; Left 75 deg  POSTURE: rounded shoulders, forward head, and flexed trunk   PALPATION: Muscular tightness bilat hip flexors;   LUMBAR ROM:   AROM eval  Flexion WFL's  Extension WFL's  Right lateral flexion WFL's  Left lateral flexion WFL's  Right rotation WFL's  Left rotation WFL's   (Blank rows = not tested)  LOWER EXTREMITY ROM:   WNL's throughout   Active  Right eval Left eval  Hip flexion    Hip extension    Hip abduction     Hip adduction    Hip internal rotation    Hip external rotation    Knee flexion    Knee extension    Ankle dorsiflexion    Ankle plantarflexion    Ankle inversion    Ankle eversion     (Blank rows = not tested)  LOWER EXTREMITY MMT:  WNL's throughout   MMT Right eval Left eval  Hip flexion    Hip extension    Hip abduction    Hip adduction    Hip internal rotation    Hip external rotation    Knee flexion    Knee extension    Ankle dorsiflexion    Ankle plantarflexion    Ankle inversion    Ankle eversion     (Blank rows = not tested)  LUMBAR SPECIAL TESTS:  Straight leg raise test: Negative and Slump test: Negative  BALANCE:  WNL's SLS bilat 10 sec bilat    GAIT: Distance walked: 40 Assistive device utilized: None Level of assistance: Complete Independence Comments: WNL's   FOTO: 51; goal 85  [facility] Adult PT Treatment:                                                DATE: 01/20/23 Therapeutic Exercise:  Nustep L7 x 5 min  Prone press up 2-3 sec x 10 Glut set 10 sec x 10  Hip extension 2 sec x 10 Rt/Lt  LE/UE extension in prone 2 sec x 10 Rt/LE  Toe yoga x 10  Towel scrunch x 1 min  Marble pick up for HEP  Ankle circles CW/CCW Gastroc stretch 30 sec x 2  Soleus stretch 30 sec x 2  Manual Therapy: Skilled palpation to assess response to DN and manual work  STM Lt foot including mobilization of  metatarsals  Trigger Point Dry-Needling  Treatment instructions: Expect mild to moderate muscle soreness. S/S of pneumothorax if dry needled over a lung field, and to seek immediate medical attention should they occur. Patient verbalized understanding of these instructions and education.  Patient Consent Given: Yes Education handout provided: Previously provided Muscles treated: interossei Lt foot btn 2nd and 3rd and 1st and second metatarsals  Electrical stimulation performed: No Parameters: N/A Treatment response/outcome: decreased tightness and improved  mobility through the metatarsals   [facility] Adult PT Treatment:                                                DATE: 01/13/23 Therapeutic Exercise: Prone press up 2-3 sec x 10 Glut set 10 sec x 10  LE/UE extension in prone 2 sec x 10 Rt/LE  Toe yoga x 10  Towel scrunch x 1 min  Marble pick up for HEP  Ankle circles CW/CCW Gastroc stretch 30 sec x 2  Soleus stretch 30 sec x 2  Manual Therapy: Skilled palpation to assess response to DN and manual work  STM Lt foot including mobilization of Engineer, drilling Dry-Needling  Treatment instructions: Expect mild to moderate muscle soreness. S/S of pneumothorax if dry needled over a lung field, and to seek immediate medical attention should they occur. Patient verbalized understanding of these instructions and education.  Patient Consent Given: Yes Education handout provided: Previously provided Muscles treated: interossei Lt foot btn 2nd and 3rd and 1st and second metatarsals  Electrical stimulation performed: No Parameters: N/A Treatment response/outcome: decreased tightness and improved mobility through the metatarsals      PATIENT EDUCATION:  Education details: POC; HEP Person educated: Patient Education method: Programmer, multimedia, Demonstration, Tactile cues, Verbal cues, and Handouts Education comprehension: verbalized understanding, returned demonstration, verbal cues required, tactile cues required, and needs further education  HOME EXERCISE PROGRAM: Access Code: [email or web address] URL: [email or web address] Date: 01/20/2023 Prepared by: Corlis Leak  Exercises - Prone Press Up  - 2 x daily - 7 x weekly - 1 sets - 10 reps - 2-3 sec  hold - Toe Yoga - Alternating Great Toe and Lesser Toe Extension  - 2 x daily - 7 x weekly - 1 sets - 10 reps - 3 sec  hold - Seated Toe Towel Scrunches  - 2 x daily - 7 x weekly - Ankle Circles in Elevation  - 2 x daily - 7 x weekly - 1 sets - 10-15 reps - Gastroc Stretch on Wall  - 2 x daily  - 7 x weekly - 1 sets - 3 reps - 30 sec  hold - Standing Gastroc Stretch  - 2 x daily - 7 x weekly - 1 sets - 3 reps - 30 sec  hold - Prone Alternating Arm and Leg Lifts  - 2 x daily - 7 x weekly - 1 sets - 10 reps - 2-3 sec  hold - Seated Marble Pick-Up with Toes  - 2 x daily - 7 x weekly - Gastroc Stretch with Foot at Wall  - 2 x daily - 7 x weekly - 1 sets - 3 reps - 10-15 sec  hold - Seated Self Great Toe Mobilization  - 2 x daily - 7 x weekly - 1 sets  Patient Education - Trigger Point Dry Needling  ASSESSMENT:  CLINICAL IMPRESSION: Patient reports improvement in  the numbness in the Lt lower leg and the ball of the Lt foot. She continues to have numbness in the plantar surface at the great toe area. Positive response to trial of DN for interossei of Lt foot.  Continued lumbar extension exercises and exercise for foot intrinsics. Progressing well toward stated goals of therapy.   OBJECTIVE IMPAIRMENTS: numbness in the ball of the Lt foot and tenderness in the medial leg along the tibia. Strength and ROM in lumbar spine and LE's is WNL's throughout. Patient has tenderness to palpation through the medial Lt leg along the tibia with palpable trigger points; decreased activity tolerance, decreased ROM, decreased strength, hypomobility, increased fascial restrictions, increased muscle spasms, impaired flexibility, improper body mechanics, postural dysfunction, and pain.    GOALS: Goals reviewed with patient? Yes  SHORT TERM GOALS: Target date: 02/03/2023    Independent in initial HEP  Baseline: Goal status: INITIAL  2.  Decreased palpable tightness to palpation medial Lt leg  Baseline:  Goal status: INITIAL   LONG TERM GOALS: Target date: 03/03/2023   Decrease sensation of numbness in Lt foot by 75-100%  Baseline:  Goal status: INITIAL  2.  Improve tolerance of walking without sensation of towel bunching at plantar surface  Baseline:  Goal status: INITIAL  3.  Resolve radicular  Lt LE symptoms  Baseline:  Goal status: INITIAL  4.  Independent in HEP  Baseline:  Goal status: INITIAL  5. Improve functional limitation score to 85  Baseline: 51  Goal status: INITIAL  PLAN:  PT FREQUENCY: 2x/week  PT DURATION: 8 weeks  PLANNED INTERVENTIONS: Therapeutic exercises, Therapeutic activity, Neuromuscular re-education, Balance training, Gait training, Patient/Family education, Self Care, Joint mobilization, Aquatic Therapy, Dry Needling, Electrical stimulation, Spinal mobilization, Cryotherapy, Moist heat, Taping, Traction, Ultrasound, Ionotophoresis /ml Dexamethasone, Manual therapy, and Re-evaluation.  PLAN FOR NEXT SESSION: review and progress with exercises; postural correction; manual, DN, modalities as indicated     Rober Minion, PT 01/20/2023, 8:49 AM

## 2023-01-24 MED ORDER — PHENTERMINE HCL 37.5 MG PO TABS: ORAL_TABLET | ORAL | 0 refills | Status: DC

## 2023-01-29 ENCOUNTER — Ambulatory Visit (AMBULATORY_SURGERY_CENTER): Payer: BC Managed Care – PPO | Admitting: Gastroenterology

## 2023-01-29 ENCOUNTER — Encounter: Payer: Self-pay | Admitting: Gastroenterology

## 2023-01-29 VITALS — BP 123/86 | HR 74 | Temp 99.1°F | Resp 15 | Ht 66.0 in | Wt 200.0 lb

## 2023-01-29 DIAGNOSIS — D122 Benign neoplasm of ascending colon: Secondary | ICD-10-CM | POA: Diagnosis not present

## 2023-01-29 DIAGNOSIS — K635 Polyp of colon: Secondary | ICD-10-CM | POA: Diagnosis not present

## 2023-01-29 DIAGNOSIS — Z1211 Encounter for screening for malignant neoplasm of colon: Secondary | ICD-10-CM | POA: Diagnosis not present

## 2023-01-29 MED ORDER — SODIUM CHLORIDE 0.9 % IV SOLN
500.0000 mL | INTRAVENOUS | Status: DC
Start: 2023-01-29 — End: 2023-01-29

## 2023-01-29 NOTE — Progress Notes (Signed)
Vitals-Sandy Rodriguez  Pt's states no medical or surgical changes since previsit or office visit. 

## 2023-01-29 NOTE — Patient Instructions (Addendum)
Resume previous diet. Continue present medications. Await pathology results. Repeat colonoscopy (date not yet determined) for surveillance based on pathology results.  YOU HAD AN ENDOSCOPIC PROCEDURE TODAY AT THE [facility]:   Refer to the procedure report that was given to you for any specific questions about what was found during the examination.  If the procedure report does not answer your questions, please call your gastroenterologist to clarify.  If you requested that your care partner not be given the details of your procedure findings, then the procedure report has been included in a sealed envelope for you to review at your convenience later.  YOU SHOULD EXPECT: Some feelings of bloating in the abdomen. Passage of more gas than usual.  Walking can help get rid of the air that was put into your GI tract during the procedure and reduce the bloating. If you had a lower endoscopy (such as a colonoscopy or flexible sigmoidoscopy) you may notice spotting of blood in your stool or on the toilet paper. If you underwent a bowel prep for your procedure, you may not have a normal bowel movement for a few days.  Please Note:  You might notice some irritation and congestion in your nose or some drainage.  This is from the oxygen used during your procedure.  There is no need for concern and it should clear up in a day or so.  SYMPTOMS TO REPORT IMMEDIATELY:  Following lower endoscopy (colonoscopy or flexible sigmoidoscopy):  Excessive amounts of blood in the stool  Significant tenderness or worsening of abdominal pains  Swelling of the abdomen that is new, acute  Fever of 100F or higher  For urgent or emergent issues, a gastroenterologist can be reached at any hour by calling [phone]. Do not use MyChart messaging for urgent concerns.    DIET:  We do recommend a small meal at first, but then you may proceed to your regular diet.  Drink plenty of fluids but you should avoid  alcoholic beverages for 24 hours.  ACTIVITY:  You should plan to take it easy for the rest of today and you should NOT DRIVE or use heavy machinery until tomorrow (because of the sedation medicines used during the test).    FOLLOW UP: Our staff will call the number listed on your records the next business day following your procedure.  We will call around 7:15- 8:00 am to check on you and address any questions or concerns that you may have regarding the information given to you following your procedure. If we do not reach you, we will leave a message.     If any biopsies were taken you will be contacted by phone or by letter within the next 1-3 weeks.  Please call us at [phone] if you have not heard about the biopsies in 3 weeks.    SIGNATURES/CONFIDENTIALITY: You and/or your care partner have signed paperwork which will be entered into your electronic medical record.  These signatures attest to the fact that that the information above on your After Visit Summary has been reviewed and is understood.  Full responsibility of the confidentiality of this discharge information lies with you and/or your care-partner. 

## 2023-01-29 NOTE — Progress Notes (Signed)
A/ox3, pleased with MAC, report to RN 

## 2023-01-29 NOTE — Op Note (Signed)
[facility] Patient Name: Sandy Rodriguez Procedure Date: 01/29/2023 9:11 AM MRN: [id number] Endoscopist: Lorin Picket E. Tomasa Rand , MD, [id number] Age: 47 Referring MD:  Date of Birth: [birth year] Gender: Female Account #: [id number] Procedure:                Colonoscopy Indications:              Screening for colorectal malignant neoplasm, This                            is the patient's first colonoscopy Medicines:                Monitored Anesthesia Care Procedure:                Pre-Anesthesia Assessment:                           - Prior to the procedure, a History and Physical                            was performed, and patient medications and                            allergies were reviewed. The patient's tolerance of                            previous anesthesia was also reviewed. The risks                            and benefits of the procedure and the sedation                            options and risks were discussed with the patient.                            All questions were answered, and informed consent                            was obtained. Prior Anticoagulants: The patient has                            taken no anticoagulant or antiplatelet agents. ASA                            Grade Assessment: II - A patient with mild systemic                            disease. After reviewing the risks and benefits,                            the patient was deemed in satisfactory condition to                            undergo the procedure.  After obtaining informed consent, the colonoscope                            was passed under direct vision. Throughout the                            procedure, the patient's blood pressure, pulse, and                            oxygen saturations were monitored continuously. The                            Olympus CF-HQ190L ([id number]) Colonoscope was                            introduced through the  anus and advanced to the the                            terminal ileum, with identification of the                            appendiceal orifice and IC valve. The colonoscopy                            was performed without difficulty. The patient                            tolerated the procedure well. The quality of the                            bowel preparation was good. The terminal ileum,                            ileocecal valve, appendiceal orifice, and rectum                            were photographed. The bowel preparation used was                            SUPREP via split dose instruction. Scope In: 9:15:00 AM Scope Out: 9:32:37 AM Scope Withdrawal Time: 0 hours 13 minutes 4 seconds  Total Procedure Duration: 0 hours 17 minutes 37 seconds  Findings:                 The perianal and digital rectal examinations were                            normal. Pertinent negatives include normal                            sphincter tone and no palpable rectal lesions.                           Two sessile polyps were found in the ascending  colon. The polyps were 3 to 10 mm in size. These                            polyps were removed with a cold snare. Resection                            and retrieval were complete. Estimated blood loss                            was minimal.                           The exam was otherwise normal throughout the                            examined colon.                           The terminal ileum appeared normal.                           The retroflexed view of the distal rectum and anal                            verge was normal and showed no anal or rectal                            abnormalities. Complications:            No immediate complications. Estimated Blood Loss:     Estimated blood loss was minimal. Impression:               - Two 3 to 10 mm polyps in the ascending colon,                             removed with a cold snare. Resected and retrieved.                           - The examined portion of the ileum was normal.                           - The distal rectum and anal verge are normal on                            retroflexion view. Recommendation:           - Patient has a contact number available for                            emergencies. The signs and symptoms of potential                            delayed complications were discussed with the  patient. Return to normal activities tomorrow.                            Written discharge instructions were provided to the                            patient.                           - Resume previous diet.                           - Continue present medications.                           - Await pathology results.                           - Repeat colonoscopy (date not yet determined) for                            surveillance based on pathology results.  E. Tomasa Rand, MD 01/29/2023 9:39:41 AM This report has been signed electronically.

## 2023-01-29 NOTE — Progress Notes (Signed)
[facility] History and Physical   Primary Care Physician:  Nolene Ebbs   Reason for Procedure:   Colon cancer screening  Plan:    Screening colonoscopy     HPI: Sandy Rodriguez is a 47 y.o. female undergoing initial average risk screening colonoscopy.  She has no family history of colon cancer and no chronic GI symptoms.    History reviewed. No pertinent past medical history.  Past Surgical History:  Procedure Laterality Date   BREAST REDUCTION SURGERY  1998   CHOLECYSTECTOMY      Prior to Admission medications   Medication Sig Start Date End Date Taking? Authorizing Provider  AMBULATORY NON FORMULARY MEDICATION LBS(lower bowel stimulator)    [provider]  CALCIUM PO Take 600 mg by mouth daily.    [provider]  levonorgestrel (MIRENA, 52 MG,) 20 MCG/24HR IUD Mirena 20 mcg/24 hours (6 yrs) 52 mg intrauterine device  Take 1 device by intrauterine route.    [provider]  Multiple Vitamin (MULTIVITAMIN) tablet Take 1 tablet by mouth daily.    [provider]  phentermine (ADIPEX-P) 37.5 MG tablet One tab by mouth qAM 01/24/23   Breeback, Lonna Cobb, PA-C    Current Outpatient Medications  Medication Sig Dispense Refill   AMBULATORY NON FORMULARY MEDICATION LBS(lower bowel stimulator)     CALCIUM PO Take 600 mg by mouth daily.     levonorgestrel (MIRENA, 52 MG,) 20 MCG/24HR IUD Mirena 20 mcg/24 hours (6 yrs) 52 mg intrauterine device  Take 1 device by intrauterine route.     Multiple Vitamin (MULTIVITAMIN) tablet Take 1 tablet by mouth daily.     phentermine (ADIPEX-P) 37.5 MG tablet One tab by mouth qAM 90 tablet 0   Current Facility-Administered Medications  Medication Dose Route Frequency Provider Last Rate Last Admin   0.9 %  sodium chloride infusion  500 mL Intravenous Continuous Jenel Lucks, MD        Allergies as of 01/29/2023 - Review Complete 01/29/2023  Allergen Reaction Noted   Belviq  [lorcaserin hcl]  08/11/2015   Sulfonamide derivatives      Family History  Problem Relation Age of Onset   Colon polyps Mother    Hypertension Mother    Cancer Father        non hodgkins lymphoma   Stroke Paternal Aunt    Heart attack Maternal Grandfather    Stroke Paternal Grandmother    Colon cancer Neg Hx    Crohn's disease Neg Hx    Esophageal cancer Neg Hx    Rectal cancer Neg Hx    Stomach cancer Neg Hx    Ulcerative colitis Neg Hx     Social History   Socioeconomic History   Marital status: Married    Spouse name: Not on file   Number of children: Not on file   Years of education: Not on file   Highest education level: Not on file  Occupational History   Not on file  Tobacco Use   Smoking status: Never   Smokeless tobacco: Never  Vaping Use   Vaping Use: Never used  Substance and Sexual Activity   Alcohol use: Yes   Drug use: No   Sexual activity: Yes  Other Topics Concern   Not on file  Social History Narrative   Not on file   Social Determinants of Health   Financial Resource Strain: Not on file  Food Insecurity: Not on file  Transportation Needs: Not on  file  Physical Activity: Not on file  Stress: Not on file  Social Connections: Not on file  Intimate Partner Violence: Not on file    Review of Systems:  All other review of systems negative except as mentioned in the HPI.  Physical Exam: Vital signs BP 105/72 (BP Location: Right Arm, Patient Position: Sitting, Cuff Size: Normal)   Pulse 74   Temp 99.1 F (37.3 C) (Temporal)   Ht 5\' 6"  (1.676 m)   Wt 200 lb (90.7 kg)   SpO2 98%   BMI 32.28 kg/m   General:   Alert,  Well-developed, well-nourished, pleasant and cooperative in NAD Airway:  Mallampati 2 Lungs:  Clear throughout to auscultation.   Heart:  Regular rate and rhythm; no murmurs, clicks, rubs,  or gallops. Abdomen:  Soft, nontender and nondistended. Normal bowel sounds.   Neuro/Psych:  Normal mood and affect. A and O x  3    E. Tomasa Rand, MD [facility]

## 2023-01-30 ENCOUNTER — Ambulatory Visit: Payer: BC Managed Care – PPO | Admitting: Rehabilitative and Restorative Service Providers"

## 2023-01-30 ENCOUNTER — Telehealth: Payer: Self-pay | Admitting: *Deleted

## 2023-01-30 NOTE — Telephone Encounter (Signed)
  Follow up Call-     01/29/2023    8:59 AM 01/29/2023    8:48 AM  Call back number  Post procedure Call Back phone  # [phone]   Permission to leave phone message  Yes     Patient questions:  Do you have a fever, pain , or abdominal swelling? No. Pain Score  0 *  Have you tolerated food without any problems? Yes.    Have you been able to return to your normal activities? Yes.    Do you have any questions about your discharge instructions: Diet   No. Medications  No. Follow up visit  No.  Do you have questions or concerns about your Care? No.  Actions: * If pain score is 4 or above: No action needed, pain <4.

## 2023-01-31 ENCOUNTER — Ambulatory Visit (INDEPENDENT_AMBULATORY_CARE_PROVIDER_SITE_OTHER): Payer: BC Managed Care – PPO | Admitting: Sports Medicine

## 2023-01-31 DIAGNOSIS — M7751 Other enthesopathy of right foot: Secondary | ICD-10-CM

## 2023-01-31 DIAGNOSIS — M5416 Radiculopathy, lumbar region: Secondary | ICD-10-CM

## 2023-01-31 NOTE — Progress Notes (Signed)
    Procedures performed today:    None.  Independent interpretation of notes and tests performed by another provider:   None.  Brief History, Exam, Impression, and Recommendations:    Retrocalcaneal bursitis, right Resolved after injection in March.  Left lumbar radiculitis Overall improving, she is getting physical therapy with dry needling, x-rays did show multilevel arthritic changes. As she continues to improve we will hold off on changing the plan but if she plateaus we will proceed with MRI and likely nerve conduction/EMG.    ____________________________________________ Ihor Austin. Benjamin Stain, M.D., ABFM., CAQSM., AME. Primary Care and Sports Medicine [facility]  Adjunct Professor of Family Medicine  [employer]  Restaurant manager, fast food

## 2023-01-31 NOTE — Assessment & Plan Note (Signed)
Resolved after injection in March.

## 2023-01-31 NOTE — Assessment & Plan Note (Signed)
Overall improving, she is getting physical therapy with dry needling, x-rays did show multilevel arthritic changes. As she continues to improve we will hold off on changing the plan but if she plateaus we will proceed with MRI and likely nerve conduction/EMG.

## 2023-02-05 NOTE — Progress Notes (Signed)
Sandy Rodriguez,   The two polyps that I removed during your recent procedure were completely benign but were proven to be "pre-cancerous" polyps that MAY have grown into cancers if they had not been removed.  Studies shows that at least 20% of women over age 45 and 30% of men over age 73 have pre-cancerous polyps.  Based on current nationally recognized surveillance guidelines, I recommend that you have a repeat colonoscopy in 3 years because of the size of the larger polyp (10mm).  The pathologist also saw evidence of a benign fatty growth called a lipoma.  These do not require any further evaluation or follow up.   If you develop any new rectal bleeding, abdominal pain or significant bowel habit changes, please contact me before then.

## 2023-02-18 ENCOUNTER — Ambulatory Visit: Payer: BC Managed Care – PPO | Attending: Sports Medicine | Admitting: Rehabilitative and Restorative Service Providers"

## 2023-02-18 ENCOUNTER — Encounter: Payer: Self-pay | Admitting: Rehabilitative and Restorative Service Providers"

## 2023-02-18 DIAGNOSIS — R293 Abnormal posture: Secondary | ICD-10-CM | POA: Diagnosis present

## 2023-02-18 DIAGNOSIS — I69918 Other symptoms and signs involving cognitive functions following unspecified cerebrovascular disease: Secondary | ICD-10-CM

## 2023-02-18 DIAGNOSIS — R200 Anesthesia of skin: Secondary | ICD-10-CM | POA: Diagnosis not present

## 2023-02-18 NOTE — Therapy (Signed)
OUTPATIENT PHYSICAL THERAPY THORACOLUMBAR TREATMENT    Patient Name: Sandy Rodriguez MRN: [id number] DOB:June 04, 1976, 47 y.o., female Today's Date: 02/18/2023  END OF SESSION:  PT End of Session - 02/18/23 0937     Visit Number 4    Number of Visits 16    Date for PT Re-Evaluation 03/03/23    Authorization Type BCBS copay - none    Authorization Time Period year    Authorization - Visit Number 4    Authorization - Number of Visits 30    PT Start Time [phone]    PT Stop Time 1015    PT Time Calculation (min) 41 min    Activity Tolerance Patient tolerated treatment well             History reviewed. No pertinent past medical history. Past Surgical History:  Procedure Laterality Date   BREAST REDUCTION SURGERY  1998   CHOLECYSTECTOMY     Patient Active Problem List   Diagnosis Date Noted   Left lumbar radiculitis 12/20/2022   Acute cough 09/27/2022   Viral upper respiratory tract infection 09/27/2022   Pilar cyst 03/08/2020   Callus of heel 04/13/2019   Sinus tarsitis, right 12/04/2018   Retrocalcaneal bursitis, right 12/12/2017   Pain at the base of the right fifth metatarsal 12/12/2017   Ankle pain, left 12/12/2017   Gastrocnemius muscle strain, right, initial encounter 11/06/2017   Onychomycosis 11/20/2016   Allergic sinusitis 06/14/2015   Class 1 obesity due to excess calories without serious comorbidity with body mass index (BMI) of 33.0 to 33.9 in adult 06/14/2015   Abnormal weight gain 06/22/2014   CN (constipation) 06/22/2014   SKIN TAG 04/09/2010    PCP: Tandy Gaw, PA-C  REFERRING PROVIDER: Dr Rodney Langton  REFERRING DIAG: lumbar radiculitis   Rationale for Evaluation and Treatment: Rehabilitation  THERAPY DIAG:  Numbness of left foot  Other symptoms and signs involving cognitive functions following unspecified cerebrovascular disease  Abnormal posture  ONSET DATE: 11/24/22  SUBJECTIVE:                                                                                                                                                                                            SUBJECTIVE STATEMENT: Patient reports compliance with HEP and feel the numbness is now more isolated to bottom of foot towards the big toe and less in the ball of the Lt foot and she is still not noticing numbness in the Lt leg. Now it more tingling than numbness in the ball of foot towards the big toe and 2nd toe.   EVAL: Patient reports onset of some numbness  in the ball of the Lt foot and decreased sensation in the inside of the Lt leg the end of February   PERTINENT HISTORY:  Unremarkable per pt report   PAIN:  Are you having pain? Yes: NPRS scale: 0/10 Pain location: none Pain description: none Aggravating factors: none Relieving factors: none  PRECAUTIONS: None  WEIGHT BEARING RESTRICTIONS: No  FALLS:  Has patient fallen in last 6 months? 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   PATIENT SURVEYS:  FOTO 51 goal 12   SENSATION: WFL except numbness in the ball of the Lt foot   MUSCLE LENGTH: Hamstrings: Right 75 deg; Left 75 deg  POSTURE: rounded shoulders, forward head, and flexed trunk   PALPATION: Muscular tightness bilat hip flexors;   LUMBAR ROM:   AROM eval  Flexion WFL's  Extension WFL's  Right lateral flexion WFL's  Left lateral flexion WFL's  Right rotation WFL's  Left rotation WFL's   (Blank rows = not tested)  LOWER EXTREMITY ROM:   WNL's  throughout   Active  Right eval Left eval  Hip flexion    Hip extension    Hip abduction    Hip adduction    Hip internal rotation    Hip external rotation    Knee flexion    Knee extension    Ankle dorsiflexion    Ankle plantarflexion    Ankle inversion    Ankle eversion     (Blank rows = not tested)  LOWER EXTREMITY MMT:  WNL's throughout   MMT Right eval Left eval  Hip flexion    Hip extension    Hip abduction    Hip adduction    Hip internal rotation    Hip external rotation    Knee flexion    Knee extension    Ankle dorsiflexion    Ankle plantarflexion    Ankle inversion    Ankle eversion     (Blank rows = not tested)  LUMBAR SPECIAL TESTS:  Straight leg raise test: Negative and Slump test: Negative  BALANCE:  WNL's SLS bilat 10 sec bilat    GAIT: Distance walked: 40 Assistive device utilized: None Level of assistance: Complete Independence Comments: WNL's   FOTO: 51; goal 85  [facility] Adult PT Treatment:                                                DATE: 02/18/23 Therapeutic Exercise:  Nustep L7 x 5 min  Prone press up 2-3 sec x 10 Glut set 10 sec x 10  Hip extension 2 sec x 10 Rt/Lt  LE/UE extension in prone 2 sec x 10 Rt/LE  Toe yoga x 10  Towel scrunch x 1 min  Marble pick up for HEP  Ankle circles CW/CCW Gastroc stretch 30 sec x 2  Soleus stretch 30 sec x 2  Stretch  for great toe flexors sitting 20 sec x 3  Manual Therapy: Skilled palpation to assess response to DN and manual work  STM Lt foot including mobilization of metatarsals  IASTM lateral leg and dorsum/plantar foot  Trigger Point Dry-Needling  Treatment instructions: Expect mild to moderate muscle soreness. S/S of pneumothorax if dry needled over a lung field, and to seek immediate medical attention should they occur. Patient verbalized understanding of these instructions and education.  Patient Consent Given: Yes Education handout provided: Previously provided Muscles  treated: interossei Lt foot btn 2nd and 3rd and 1st and second metatarsals; flexor hallucis longus  Electrical stimulation performed: No Parameters: N/A Treatment response/outcome: decreased tightness and improved mobility through the metatarsals   [facility] Adult PT Treatment:                                                DATE: 01/20/23 Therapeutic Exercise:  Nustep L7 x 5 min  Prone press up 2-3 sec x 10 Glut set 10 sec x 10  Hip extension 2 sec x 10 Rt/Lt  LE/UE extension in prone 2 sec x 10 Rt/LE  Toe yoga x 10  Towel scrunch x 1 min  Marble pick up for HEP  Ankle circles CW/CCW Gastroc stretch 30 sec x 2  Soleus stretch 30 sec x 2  Manual Therapy: Skilled palpation to assess response to DN and manual work  STM Lt foot including mobilization of Engineer, drilling Dry-Needling  Treatment instructions: Expect mild to moderate muscle soreness. S/S of pneumothorax if dry needled over a lung field, and to seek immediate medical attention should they occur. Patient verbalized understanding of these instructions and education.  Patient Consent Given: Yes Education handout provided: Previously provided Muscles treated: interossei Lt foot btn 2nd and 3rd and 1st and second metatarsals  Electrical stimulation performed: No Parameters: N/A Treatment response/outcome: decreased tightness and improved mobility through the metatarsals   PATIENT EDUCATION:  Education details: POC; HEP Person educated: Patient Education method: Programmer, multimedia, Demonstration, Tactile cues, Verbal cues, and Handouts Education comprehension: verbalized understanding, returned demonstration, verbal cues required, tactile cues required, and needs further education  HOME EXERCISE PROGRAM: Access Code: [email or web address] URL: [email or web address] Date: 01/20/2023 Prepared by: Corlis Leak  Exercises - Prone Press Up  - 2 x daily - 7 x weekly - 1 sets - 10 reps - 2-3 sec  hold - Toe Yoga - Alternating  Great Toe and Lesser Toe Extension  - 2 x daily - 7 x weekly - 1 sets - 10 reps - 3 sec  hold - Seated Toe Towel Scrunches  - 2 x daily - 7 x weekly - Ankle Circles in Elevation  - 2 x daily - 7 x weekly - 1 sets - 10-15 reps - Gastroc Stretch on Wall  - 2 x daily - 7 x weekly - 1 sets - 3 reps - 30 sec  hold - Standing Gastroc Stretch  - 2 x daily - 7 x weekly - 1 sets - 3 reps - 30 sec  hold - Prone Alternating Arm and Leg Lifts  - 2 x daily - 7 x weekly - 1 sets - 10 reps - 2-3 sec  hold - Seated Marble Pick-Up with Toes  - 2 x daily - 7 x weekly - Gastroc Stretch with Foot at Wall  - 2 x  daily - 7 x weekly - 1 sets - 3 reps - 10-15 sec  hold - Seated Self Great Toe Mobilization  - 2 x daily - 7 x weekly - 1 sets  Patient Education - Trigger Point Dry Needling  ASSESSMENT:  CLINICAL IMPRESSION: Patient reports improvement in the numbness in the Lt lower leg and the ball of the Lt foot. She is no longer having numbness, more just tingling. Can tell she is making progress. She continues to have tingling in the plantar surface at the great toe area. Added trial of DN to flexor hallucis longus and continued with DN for interossei of Lt foot.  Continued lumbar extension exercises and exercise for foot intrinsics. Progressing well toward stated goals of therapy.   OBJECTIVE IMPAIRMENTS: numbness in the ball of the Lt foot and tenderness in the medial leg along the tibia. Strength and ROM in lumbar spine and LE's is WNL's throughout. Patient has tenderness to palpation through the medial Lt leg along the tibia with palpable trigger points; decreased activity tolerance, decreased ROM, decreased strength, hypomobility, increased fascial restrictions, increased muscle spasms, impaired flexibility, improper body mechanics, postural dysfunction, and pain.    GOALS: Goals reviewed with patient? Yes  SHORT TERM GOALS: Target date: 02/03/2023    Independent in initial HEP  Baseline: Goal status:  met  2.  Decreased palpable tightness to palpation medial Lt leg  Baseline:  Goal status: met   LONG TERM GOALS: Target date: 03/03/2023   Decrease sensation of numbness in Lt foot by 75-100%  Baseline:  Goal status: INITIAL  2.  Improve tolerance of walking without sensation of towel bunching at plantar surface  Baseline:  Goal status: INITIAL  3.  Resolve radicular Lt LE symptoms  Baseline:  Goal status: INITIAL  4.  Independent in HEP  Baseline:  Goal status: INITIAL  5. Improve functional limitation score to 85  Baseline: 51  Goal status: INITIAL  PLAN:  PT FREQUENCY: 2x/week  PT DURATION: 8 weeks  PLANNED INTERVENTIONS: Therapeutic exercises, Therapeutic activity, Neuromuscular re-education, Balance training, Gait training, Patient/Family education, Self Care, Joint mobilization, Aquatic Therapy, Dry Needling, Electrical stimulation, Spinal mobilization, Cryotherapy, Moist heat, Taping, Traction, Ultrasound, Ionotophoresis 4mg /ml Dexamethasone, Manual therapy, and Re-evaluation.  PLAN FOR NEXT SESSION: review and progress with exercises; postural correction; manual, DN, modalities as indicated     Rober Minion, PT 02/18/2023, 9:37 AM

## 2023-02-19 ENCOUNTER — Ambulatory Visit (INDEPENDENT_AMBULATORY_CARE_PROVIDER_SITE_OTHER): Payer: BC Managed Care – PPO

## 2023-02-19 DIAGNOSIS — Z1231 Encounter for screening mammogram for malignant neoplasm of breast: Secondary | ICD-10-CM | POA: Diagnosis not present

## 2023-02-27 NOTE — Progress Notes (Signed)
Please call patient. Normal mammogram.  Repeat in 1 year.  

## 2023-03-05 ENCOUNTER — Ambulatory Visit: Payer: BC Managed Care – PPO | Attending: Sports Medicine | Admitting: Rehabilitative and Restorative Service Providers"

## 2023-03-05 ENCOUNTER — Encounter: Payer: Self-pay | Admitting: Rehabilitative and Restorative Service Providers"

## 2023-03-05 DIAGNOSIS — R293 Abnormal posture: Secondary | ICD-10-CM | POA: Diagnosis present

## 2023-03-05 DIAGNOSIS — R200 Anesthesia of skin: Secondary | ICD-10-CM | POA: Diagnosis not present

## 2023-03-05 DIAGNOSIS — I69918 Other symptoms and signs involving cognitive functions following unspecified cerebrovascular disease: Secondary | ICD-10-CM | POA: Insufficient documentation

## 2023-03-05 NOTE — Therapy (Signed)
OUTPATIENT PHYSICAL THERAPY THORACOLUMBAR TREATMENT and DISCHARGE SUMMARY  PHYSICAL THERAPY DISCHARGE SUMMARY  Visits from Start of Care: 5  Current functional level related to goals / functional outcomes: See progress note    Remaining deficits: Tingling in the dorsum of foot toward great toe    Education / Equipment: HEP    Patient agrees to discharge. Patient goals were met. Patient is being discharged due to meeting the stated rehab goals.   P. Leonor Liv PT, MPH 03/05/23 10:04 AM  Patient Name: LATIA REINKING MRN: [id number] DOB:1976-04-27, 47 y.o., female Today's Date: 03/05/2023  END OF SESSION:  PT End of Session - 03/05/23 0931     Visit Number 5    Number of Visits 16    Date for PT Re-Evaluation 03/03/23    Authorization Type BCBS copay - none    Authorization Time Period year    Authorization - Visit Number 5    Authorization - Number of Visits 30    PT Start Time [phone]    PT Stop Time 1010    PT Time Calculation (min) 41 min             History reviewed. No pertinent past medical history. Past Surgical History:  Procedure Laterality Date   BREAST REDUCTION SURGERY  09/30/1996   CHOLECYSTECTOMY     REDUCTION MAMMAPLASTY     Patient Active Problem List   Diagnosis Date Noted   Left lumbar radiculitis 12/20/2022   Acute cough 09/27/2022   Viral upper respiratory tract infection 09/27/2022   Pilar cyst 03/08/2020   Callus of heel 04/13/2019   Sinus tarsitis, right 12/04/2018   Retrocalcaneal bursitis, right 12/12/2017   Pain at the base of the right fifth metatarsal 12/12/2017   Ankle pain, left 12/12/2017   Gastrocnemius muscle strain, right, initial encounter 11/06/2017   Onychomycosis 11/20/2016   Allergic sinusitis 06/14/2015   Class 1 obesity due to excess calories without serious comorbidity with body mass index (BMI) of 33.0 to 33.9 in adult 06/14/2015   Abnormal weight gain 06/22/2014   CN (constipation) 06/22/2014   SKIN TAG 04/09/2010     PCP: Tandy Gaw, PA-C  REFERRING PROVIDER: Dr Rodney Langton  REFERRING DIAG: lumbar radiculitis   Rationale for Evaluation and Treatment: Rehabilitation  THERAPY DIAG:  Numbness of left foot  Other symptoms and signs involving cognitive functions following unspecified cerebrovascular disease  Abnormal posture  ONSET DATE: 11/24/22  SUBJECTIVE:  SUBJECTIVE STATEMENT: Patient reports that she is having less numbness in the L foot. The numbness is constant but less intense. Numbness is now more isolated to bottom of foot towards the big toe and less in the ball of the Lt foot with no Lt leg. Now it more tingling than numbness in the ball of foot towards the big toe and 2nd toe. Feels comfortable with continuing with independent HEP  EVAL: Patient reports onset of some numbness in the ball of the Lt foot and decreased sensation in the inside of the Lt leg the end of February   PERTINENT HISTORY:  Unremarkable per pt report   PAIN:  Are you having pain? Yes: NPRS scale: 0/10 Pain location: none Pain description: none Aggravating factors: none Relieving factors: none  PRECAUTIONS: None  WEIGHT BEARING RESTRICTIONS: No  FALLS:  Has patient fallen in last 6 months? 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   PATIENT  SURVEYS:  FOTO 51 goal 85 03/04/23: 94  SENSATION: WFL except numbness in the ball of the Lt foot   MUSCLE LENGTH: Hamstrings: Right 75 deg; Left 75 deg  POSTURE: rounded shoulders, forward head, and flexed trunk   PALPATION: Muscular tightness bilat hip flexors;   LUMBAR ROM:   AROM eval  Flexion WFL's  Extension WFL's  Right lateral flexion WFL's  Left lateral flexion WFL's  Right rotation WFL's  Left rotation WFL's   (Blank rows = not tested)  LOWER EXTREMITY ROM:   WNL's throughout   Active  Right eval Left eval  Hip flexion    Hip extension    Hip abduction    Hip adduction    Hip internal rotation    Hip external rotation    Knee flexion    Knee extension    Ankle dorsiflexion    Ankle plantarflexion    Ankle inversion    Ankle eversion     (Blank rows = not tested)  LOWER EXTREMITY MMT:  WNL's throughout   MMT Right eval Left eval  Hip flexion    Hip extension    Hip abduction    Hip adduction    Hip internal rotation    Hip external rotation    Knee flexion    Knee extension    Ankle dorsiflexion    Ankle plantarflexion    Ankle inversion    Ankle eversion     (Blank rows = not tested)  LUMBAR SPECIAL TESTS:  Straight leg raise test: Negative and Slump test: Negative  BALANCE:  WNL's SLS bilat 10 sec bilat    GAIT: Distance walked: 40 Assistive device utilized: None Level of assistance: Complete Independence Comments: WNL's   FOTO: 51; goal 85  [facility] Adult PT Treatment:  DATE: 03/05/23 Therapeutic Exercise: Nustep L7 x 6 min  Prone press up 2-3 sec x 10 Glut set 10 sec x 10  Hip extension 2 sec x 10 Rt/Lt  LE/UE extension in prone 2 sec x 10 Rt/LE  Toe yoga x 10  Towel scrunch x 1 min  Marble pick up for HEP  Ankle circles CW/CCW Gastroc stretch 30 sec x 2  Soleus stretch 30 sec x 2  Stretch for great toe flexors sitting 20 sec x 3  Manual Therapy: Skilled palpation to assess  response to manual work  STM Lt foot including mobilization of metatarsals and passive stretch for toe extensors IASTM lateral leg and dorsum/plantar foot  Note decreased tightness and improved mobility through the metatarsals   [facility] Adult PT Treatment:                                                DATE: 02/18/23 Therapeutic Exercise:  Nustep L7 x 5 min  Prone press up 2-3 sec x 10 Glut set 10 sec x 10  Hip extension 2 sec x 10 Rt/Lt  LE/UE extension in prone 2 sec x 10 Rt/LE  Toe yoga x 10  Towel scrunch x 1 min  Marble pick up for HEP  Ankle circles CW/CCW Gastroc stretch 30 sec x 2  Soleus stretch 30 sec x 2  Stretch for great toe flexors sitting 20 sec x 3  Manual Therapy: Skilled palpation to assess response to DN and manual work  STM Lt foot including mobilization of metatarsals  IASTM lateral leg and dorsum/plantar foot  Trigger Point Dry-Needling  Treatment instructions: Expect mild to moderate muscle soreness. S/S of pneumothorax if dry needled over a lung field, and to seek immediate medical attention should they occur. Patient verbalized understanding of these instructions and education.  Patient Consent Given: Yes Education handout provided: Previously provided Muscles treated: interossei Lt foot btn 2nd and 3rd and 1st and second metatarsals; flexor hallucis longus  Electrical stimulation performed: No Parameters: N/A Treatment response/outcome: decreased tightness and improved mobility through the metatarsals   PATIENT EDUCATION:  Education details: POC; HEP Person educated: Patient Education method: Programmer, multimedia, Demonstration, Actor cues, Verbal cues, and Handouts Education comprehension: verbalized understanding, returned demonstration, verbal cues required, tactile cues required, and needs further education  HOME EXERCISE PROGRAM: Access Code: [email or web address] URL: [email or web address] Date: 01/20/2023 Prepared by: Corlis Leak  Exercises - Prone  Press Up  - 2 x daily - 7 x weekly - 1 sets - 10 reps - 2-3 sec  hold - Toe Yoga - Alternating Great Toe and Lesser Toe Extension  - 2 x daily - 7 x weekly - 1 sets - 10 reps - 3 sec  hold - Seated Toe Towel Scrunches  - 2 x daily - 7 x weekly - Ankle Circles in Elevation  - 2 x daily - 7 x weekly - 1 sets - 10-15 reps - Gastroc Stretch on Wall  - 2 x daily - 7 x weekly - 1 sets - 3 reps - 30 sec  hold - Standing Gastroc Stretch  - 2 x daily - 7 x weekly - 1 sets - 3 reps - 30 sec  hold - Prone Alternating Arm and Leg Lifts  - 2 x daily - 7 x weekly - 1 sets - 10 reps - 2-3 sec  hold - Seated Marble Pick-Up with Toes  - 2 x daily - 7 x weekly - Gastroc Stretch with Foot at Wall  - 2 x daily - 7 x weekly - 1 sets - 3 reps - 10-15 sec  hold - Seated Self Great Toe Mobilization  - 2 x daily - 7 x weekly - 1 sets  Patient Education - Trigger Point Dry Needling  ASSESSMENT:  CLINICAL IMPRESSION: Patient reports improvement in the numbness in the Lt lower leg and the ball of the Lt foot. She is no longer having numbness, more just tingling and only in the plantar surface toward the Lt great toe. She is pleased with progress and confident in continuing with independent HEP. Goals of therapy accomplished and patient is independent in HEP.   OBJECTIVE IMPAIRMENTS: numbness in the ball of the Lt foot and tenderness in the medial leg along the tibia. Strength and ROM in lumbar spine and LE's is WNL's throughout. Patient has tenderness to palpation through the medial Lt leg along the tibia with palpable trigger points; decreased activity tolerance, decreased ROM, decreased strength, hypomobility, increased fascial restrictions, increased muscle spasms, impaired flexibility, improper body mechanics, postural dysfunction, and pain.    GOALS: Goals reviewed with patient? Yes  SHORT TERM GOALS: Target date: 02/03/2023    Independent in initial HEP  Baseline: Goal status: met  2.  Decreased palpable  tightness to palpation medial Lt leg  Baseline:  Goal status: met   LONG TERM GOALS: Target date: 03/03/2023   Decrease sensation of numbness in Lt foot by 75-100%  Baseline:  Goal status: met  2.  Improve tolerance of walking without sensation of towel bunching at plantar surface  Baseline:  Goal status: met  3.  Resolve radicular Lt LE symptoms  Baseline:  Goal status: met  4.  Independent in HEP  Baseline:  Goal status: met  5. Improve functional limitation score to 85  Baseline: 51  03/05/23: 94  Goal status: met  PLAN:  PT FREQUENCY: 2x/week  PT DURATION: 8 weeks  PLANNED INTERVENTIONS: Therapeutic exercises, Therapeutic activity, Neuromuscular re-education, Balance training, Gait training, Patient/Family education, Self Care, Joint mobilization, Aquatic Therapy, Dry Needling, Electrical stimulation, Spinal mobilization, Cryotherapy, Moist heat, Taping, Traction, Ultrasound, Ionotophoresis 4mg /ml Dexamethasone, Manual therapy, and Re-evaluation.  PLAN:  D/C to independent HEP    Rober Minion, PT 03/05/2023, 9:32 AM

## 2023-05-16 ENCOUNTER — Encounter: Payer: Self-pay | Admitting: Sports Medicine

## 2023-05-16 DIAGNOSIS — Z639 Problem related to primary support group, unspecified: Secondary | ICD-10-CM

## 2023-05-18 NOTE — Telephone Encounter (Signed)
Please see the MyChart message reply(ies) for my assessment and plan.    This patient gave consent for this Medical Advice Message and is aware that it may result in a bill to their insurance company, as well as the possibility of receiving a bill for a co-payment or deductible. They are an established patient, but are not seeking medical advice exclusively about a problem treated during an in person or video visit in the last seven days. I did not recommend an in person or video visit within seven days of my reply.    I spent a total of 5 minutes cumulative time within 7 days through MyChart messaging.  Thomas Thekkekandam, MD   

## 2023-05-30 ENCOUNTER — Other Ambulatory Visit (INDEPENDENT_AMBULATORY_CARE_PROVIDER_SITE_OTHER): Payer: BC Managed Care – PPO

## 2023-05-30 ENCOUNTER — Ambulatory Visit (INDEPENDENT_AMBULATORY_CARE_PROVIDER_SITE_OTHER): Payer: BC Managed Care – PPO | Admitting: Sports Medicine

## 2023-05-30 ENCOUNTER — Encounter: Payer: Self-pay | Admitting: Sports Medicine

## 2023-05-30 DIAGNOSIS — M7751 Other enthesopathy of right foot: Secondary | ICD-10-CM | POA: Diagnosis not present

## 2023-05-30 MED ORDER — TRIAMCINOLONE ACETONIDE 40 MG/ML IJ SUSP
40.0000 mg | Freq: Once | INTRAMUSCULAR | Status: AC
Start: 2023-05-30 — End: 2023-05-30
  Administered 2023-05-30: 40 mg via INTRAMUSCULAR

## 2023-05-30 NOTE — Assessment & Plan Note (Signed)
Very pleasant 47 year old female, she has chronic posterior right heel pain, historically MRI has confirmed distal Achilles tendinosis with retrocalcaneal bursitis, she has done well with intermittent retrocalcaneal bursa steroid injections with ultrasound guidance taking care to avoid intra Achilles placement of medication, we always place her in a boot for at least 1 week as there are injections for Her last injection was in March of this year, now having recurrence of pain, she has been through physical therapy including heel lifts, eccentric conditioning, topical nitroglycerin. Continues to have discomfort, we did an injection today, she will do a week of boot immobilization and I would like her to touch base with Dr. Ralene Cork downstairs for discussion of potential surgical options.

## 2023-05-30 NOTE — Addendum Note (Signed)
Addended by: Carren Rang A on: 05/30/2023 02:10 PM   Modules accepted: Orders

## 2023-05-30 NOTE — Progress Notes (Signed)
    Procedures performed today:    Procedure: Real-time Ultrasound Guided injection of the right retrocalcaneal bursa Device: Samsung HS60  Verbal informed consent obtained.  Time-out conducted.  Noted no overlying erythema, induration, or other signs of local infection.  Skin prepped in a sterile fashion.  Local anesthesia: Topical Ethyl chloride.  With sterile technique and under real time ultrasound guidance: Taking care to avoid intra Achilles injection I injected 1 cc kenalog 40, 1 cc lidocaine into the retrocalcaneal bursa.   Completed without difficulty  Advised to call if fevers/chills, erythema, induration, drainage, or persistent bleeding.  Images permanently stored and available for review in PACS.  Impression: Technically successful ultrasound guided injection.  Independent interpretation of notes and tests performed by another provider:   None.  Brief History, Exam, Impression, and Recommendations:    Retrocalcaneal bursitis, right Very pleasant 47 year old female, she has chronic posterior right heel pain, historically MRI has confirmed distal Achilles tendinosis with retrocalcaneal bursitis, she has done well with intermittent retrocalcaneal bursa steroid injections with ultrasound guidance taking care to avoid intra Achilles placement of medication, we always place her in a boot for at least 1 week as there are injections for Her last injection was in March of this year, now having recurrence of pain, she has been through physical therapy including heel lifts, eccentric conditioning, topical nitroglycerin. Continues to have discomfort, we did an injection today, she will do a week of boot immobilization and I would like her to touch base with Dr. Ralene Cork downstairs for discussion of potential surgical options.    ____________________________________________ Ihor Austin. Benjamin Stain, M.D., ABFM., CAQSM., AME. Primary Care and Sports Medicine [facility]  [facility]  Adjunct Professor of Family Medicine  [employer]  Restaurant manager, fast food

## 2023-06-10 ENCOUNTER — Ambulatory Visit: Payer: Self-pay | Admitting: Podiatry

## 2023-06-12 ENCOUNTER — Ambulatory Visit (INDEPENDENT_AMBULATORY_CARE_PROVIDER_SITE_OTHER): Payer: BC Managed Care – PPO | Admitting: Podiatry

## 2023-06-12 ENCOUNTER — Other Ambulatory Visit: Payer: Self-pay

## 2023-06-12 ENCOUNTER — Ambulatory Visit (INDEPENDENT_AMBULATORY_CARE_PROVIDER_SITE_OTHER): Payer: BC Managed Care – PPO

## 2023-06-12 ENCOUNTER — Encounter: Payer: Self-pay | Admitting: Podiatry

## 2023-06-12 DIAGNOSIS — M7661 Achilles tendinitis, right leg: Secondary | ICD-10-CM

## 2023-06-12 DIAGNOSIS — M722 Plantar fascial fibromatosis: Secondary | ICD-10-CM | POA: Diagnosis not present

## 2023-06-12 DIAGNOSIS — M25571 Pain in right ankle and joints of right foot: Secondary | ICD-10-CM | POA: Diagnosis not present

## 2023-06-12 DIAGNOSIS — M7731 Calcaneal spur, right foot: Secondary | ICD-10-CM | POA: Diagnosis not present

## 2023-06-12 MED ORDER — DEXAMETHASONE SODIUM PHOSPHATE 120 MG/30ML IJ SOLN
4.0000 mg | Freq: Once | INTRAMUSCULAR | Status: AC
Start: 2023-06-12 — End: 2023-06-12
  Administered 2023-06-12: 4 mg via INTRA_ARTICULAR

## 2023-06-12 MED ORDER — TRIAMCINOLONE ACETONIDE 10 MG/ML IJ SUSP
2.5000 mg | Freq: Once | INTRAMUSCULAR | Status: AC
Start: 2023-06-12 — End: 2023-06-12
  Administered 2023-06-12: 2.5 mg via INTRA_ARTICULAR

## 2023-06-12 NOTE — Progress Notes (Signed)
  Subjective:  Patient ID: Sandy Rodriguez, female    DOB: [birth year],   MRN: [id number]  Chief Complaint  Patient presents with   Ankle Pain    Pt presents with pain in the right ankle that she's been having for over 5 years. Pt has been seeing someone for it and they referred her to our office.    47 y.o. female presents for concern of right ankle pain that has been going on for 5 years or so. Relates she used to play a lot of soccer and very active when she was younger. Relates 5 years ago she started having pain in the back of her ankle. She has had oral anti-inflammatories, stretching, PT and injections in the achilles. She has also had plantar fasciitis in the past and years ago had an injection for this. Relates these things used to help but no longer helping. Has been in the care of Dr. Karie Schwalbe and was sent here to discuss surgical options at this time. She has had an MRI done but it was 5 years ago and does not have records with her.  . Denies any other pedal complaints. Denies n/v/f/c.   No past medical history on file.  Objective:  Physical Exam: Vascular: DP/PT pulses 2/4 bilateral. CFT <3 seconds. Normal hair growth on digits. No edema.  Skin. No lacerations or abrasions bilateral feet.  Musculoskeletal: MMT 5/5 bilateral lower extremities in DF, PF, Inversion and Eversion. Deceased ROM in DF of ankle joint. Tender to achilles insertion on the right. Some pain with DF. Very tender to the medial calcaneal tubercle on the right. No pain along arch and no pain along PT. No pain with calcaneal squeeze Neurological: Sensation intact to light touch.   Assessment:   1. Tendonitis, Achilles, right   2. Plantar fasciitis, right      Plan:  Patient was evaluated and treated and all questions answered. Discussed achilles tendonitsi  plantar fasciitis with patient.  X-rays reviewed and discussed with patient. No acute fractures or dislocations noted. Mild spurring noted at inferior calcaneus  and haglunds deformity noted at posterior calcaneus.  Discussed treatment options including, ice, NSAIDS, supportive shoes, bracing, and stretching. Stretching exercises provided to be done on a daily basis.   Continue anti-inflammatories.  Discussed trying injeciton into plantar fascia as more pain noted here today and not had an injeciton in years in this area.  Did discuss getting a new MRI as previous is 47 years old and issues are slightly different today. Discussed likely needing surgery for this foot and MRI will help decide what surgery maybe necessary. Discussed haglunds resection and achilles repair  and possible plantar fascia release in detail with patient.  She is not diabetic , no heart disease, no history of blood clots and does not smoke.  Follow-up after MRI to discuss results.   Procedure:  Discussed etiology, pathology, conservative vs. surgical therapies. At this time a plantar fascial injection was recommended.  The patient agreed and a sterile skin prep was applied.  An injection consisting of  1cc dexamethasone 0.5 cc kenalog and 1cc marcaine mixture was infiltrated at the point of maximal tenderness on the right Heel.  Bandaid applied. The patient tolerated this well and was given instructions for aftercare.     Louann Sjogren, DPM

## 2023-07-03 DIAGNOSIS — Z1151 Encounter for screening for human papillomavirus (HPV): Secondary | ICD-10-CM | POA: Diagnosis not present

## 2023-07-03 DIAGNOSIS — Z124 Encounter for screening for malignant neoplasm of cervix: Secondary | ICD-10-CM | POA: Diagnosis not present

## 2023-07-03 DIAGNOSIS — Z01419 Encounter for gynecological examination (general) (routine) without abnormal findings: Secondary | ICD-10-CM | POA: Diagnosis not present

## 2023-07-03 DIAGNOSIS — Z6834 Body mass index (BMI) 34.0-34.9, adult: Secondary | ICD-10-CM | POA: Diagnosis not present

## 2023-07-03 DIAGNOSIS — Z1331 Encounter for screening for depression: Secondary | ICD-10-CM | POA: Diagnosis not present

## 2023-07-06 ENCOUNTER — Ambulatory Visit
Admission: RE | Admit: 2023-07-06 | Discharge: 2023-07-06 | Disposition: A | Discharge status: Home / Self Care | Payer: BC Managed Care – PPO | Source: Ambulatory Visit | Attending: Podiatry | Admitting: Podiatry

## 2023-07-06 DIAGNOSIS — M79671 Pain in right foot: Secondary | ICD-10-CM | POA: Diagnosis not present

## 2023-07-06 DIAGNOSIS — M722 Plantar fascial fibromatosis: Secondary | ICD-10-CM

## 2023-07-06 DIAGNOSIS — G8929 Other chronic pain: Secondary | ICD-10-CM | POA: Diagnosis not present

## 2023-07-06 DIAGNOSIS — M7661 Achilles tendinitis, right leg: Secondary | ICD-10-CM

## 2023-07-21 ENCOUNTER — Encounter: Payer: Self-pay | Admitting: Podiatry

## 2023-07-25 ENCOUNTER — Encounter: Payer: Self-pay | Admitting: Podiatry

## 2023-07-25 ENCOUNTER — Ambulatory Visit (INDEPENDENT_AMBULATORY_CARE_PROVIDER_SITE_OTHER): Payer: BC Managed Care – PPO | Admitting: Podiatry

## 2023-07-25 DIAGNOSIS — M722 Plantar fascial fibromatosis: Secondary | ICD-10-CM | POA: Diagnosis not present

## 2023-07-25 DIAGNOSIS — M7661 Achilles tendinitis, right leg: Secondary | ICD-10-CM | POA: Diagnosis not present

## 2023-07-25 NOTE — Progress Notes (Signed)
Subjective:  Patient ID: Sandy Rodriguez, female    DOB: May 23, 1976,   MRN: [id number]  No chief complaint on file.   47 y.o. female presents for follow-up of right achilles tendonitis and plantar fasciitis. She has had her MRI and here to review. Relates last injections didn't help much at all . Patient has exhausted conservative efforts and ready to talk about surgery.   History: Initially presented with concern of right ankle pain that has been going on for 5 years or so. Relates she used to play a lot of soccer and very active when she was younger. Relates 5 years ago she started having pain in the back of her ankle. She has had oral anti-inflammatories, stretching, PT and injections in the achilles. She has also had plantar fasciitis in the past and years ago had an injection for this. Relates these things used to help but no longer helping. Has been in the care of Dr. Karie Schwalbe and was sent here to discuss surgical options at this time. She has had an MRI done but it was 5 years ago and does not have records with her.  . Denies any other pedal complaints. Denies n/v/f/c.   No past medical history on file.  Objective:  Physical Exam: Vascular: DP/PT pulses 2/4 bilateral. CFT <3 seconds. Normal hair growth on digits. No edema.  Skin. No lacerations or abrasions bilateral feet.  Musculoskeletal: MMT 5/5 bilateral lower extremities in DF, PF, Inversion and Eversion. Deceased ROM in DF of ankle joint. Tender to achilles insertion on the right. Some pain with DF. Very tender to the medial calcaneal tubercle on the right. No pain along arch and no pain along PT. No pain with calcaneal squeeze Neurological: Sensation intact to light touch.   MRI Awaiting final read.   Assessment:   1. Tendonitis, Achilles, right   2. Plantar fasciitis, right       Plan:  Patient was evaluated and treated and all questions answered. Discussed achilles tendonitsi  plantar fasciitis with patient.  X-rays reviewed  and discussed with patient. No acute fractures or dislocations noted. Mild spurring noted at inferior calcaneus and haglunds deformity noted at posterior calcaneus.  Discussed treatment options including, ice, NSAIDS, supportive shoes, bracing, and stretching. Stretching exercises provided to be done on a daily basis.   Continue anti-inflammatories.  Discussed trying injeciton into plantar fascia as more pain noted here today and not had an injeciton in years in this area.  MRI reviewed with patient. No final read but able to see tendinosis and inflammation at achilles insertion site as well at insertion of plantar fascia. Discussed findings and discussed surgical intervention options.  Discussed right haglund's resection and secondary achilles repair and plantar fascia release. Discussed perioperative course in detail.  -Informed surgical risk consent was reviewed and read aloud to the patient.  I reviewed the films.  I have discussed my findings with the patient in great detail.  I have discussed all risks including but not limited to infection, stiffness, scarring, limp, disability, deformity, damage to blood vessels and nerves, numbness, poor healing, need for braces, arthritis, chronic pain, amputation, death.  All benefits and realistic expectations discussed in great detail.  I have made no promises as to the outcome.  I have provided realistic expectations.  I have offered the patient a 2nd opinion, which they have declined and assured me they preferred to proceed despite the risks. She is not diabetic , no heart disease, no history of blood clots  and does not smoke.  Will plan for surgery December 31st.   Post-op meds: Keflex, Percocet 5/325 mg, ASA BID, zofran NWB for four weeks in CAM boot.     Louann Sjogren, DPM

## 2023-08-12 ENCOUNTER — Encounter: Payer: Self-pay | Admitting: Podiatry

## 2023-08-27 ENCOUNTER — Telehealth: Payer: Self-pay | Admitting: Podiatry

## 2023-08-27 NOTE — Telephone Encounter (Signed)
DOS-09/30/2023  CALCANEAL OSTECTOMY [id number] SECONDARY REPAIR ACHILLES TENDON [id number] REMOVAL PLANTAR FASCIA [id number]  [employer]BS EFFECTIVE DATE-09/30/2022  DEDUCTIBLE- $5000.00 WITH REMAINING $0.00 OOP-$8000.00 WITH REMAINING $6962.95 COINSURANCE- 30%  SPOKE WITH CHRIS R. FROM [employer] AND HE STATED THAT PRIOR AUTH IS NOT REQUIRED FOR CPT CODES 28413,24401 AND 02725  CALL REF NUMBER: CHRIS R 08/27/23 @ 8:31 AM EST

## 2023-09-16 ENCOUNTER — Encounter: Payer: Self-pay | Admitting: Podiatry

## 2023-09-25 ENCOUNTER — Telehealth: Payer: Self-pay | Admitting: Podiatry

## 2023-09-25 NOTE — Telephone Encounter (Signed)
Completed FMLA paperwork for [employer] .Marland Kitchen...   Dr. Ralene Cork confirmed RTW date is 10/13/2023 -- (patient works from home).  Also completed a disability placard for her car -- Dr. Cleophas Dunker it for 6 months ....   Completed the placard request and placed up front for patient to pick up and take to the [facility].     Faxed FMLA docs tgo [phone] .....     <><><> Patient had at least one office visit in [place] -- LMVM for patient to advise which office would be easiest to pick up the placard request -- is currently in the [place] office .Marland Kitchen...    J. Abbott -- 09/25/2023

## 2023-09-26 ENCOUNTER — Telehealth: Payer: Self-pay | Admitting: Podiatry

## 2023-09-30 ENCOUNTER — Encounter: Payer: Self-pay | Admitting: Podiatry

## 2023-09-30 ENCOUNTER — Other Ambulatory Visit: Payer: Self-pay | Admitting: Podiatry

## 2023-09-30 DIAGNOSIS — M7661 Achilles tendinitis, right leg: Secondary | ICD-10-CM | POA: Diagnosis not present

## 2023-09-30 DIAGNOSIS — M7731 Calcaneal spur, right foot: Secondary | ICD-10-CM | POA: Diagnosis not present

## 2023-09-30 DIAGNOSIS — M25774 Osteophyte, right foot: Secondary | ICD-10-CM | POA: Diagnosis not present

## 2023-09-30 DIAGNOSIS — M722 Plantar fascial fibromatosis: Secondary | ICD-10-CM | POA: Diagnosis not present

## 2023-09-30 DIAGNOSIS — G8918 Other acute postprocedural pain: Secondary | ICD-10-CM | POA: Diagnosis not present

## 2023-09-30 MED ORDER — ASPIRIN 81 MG PO TBEC: 81.0000 mg | DELAYED_RELEASE_TABLET | Freq: Two times a day (BID) | ORAL | Status: AC

## 2023-09-30 MED ORDER — ONDANSETRON HCL 4 MG PO TABS: 4.0000 mg | ORAL_TABLET | Freq: Three times a day (TID) | ORAL | 0 refills | Status: DC | PRN

## 2023-09-30 MED ORDER — CEPHALEXIN 500 MG PO CAPS: 500.0000 mg | ORAL_CAPSULE | Freq: Four times a day (QID) | ORAL | 0 refills | Status: AC

## 2023-09-30 MED ORDER — FLUCONAZOLE 150 MG PO TABS: 150.0000 mg | ORAL_TABLET | Freq: Once | ORAL | 0 refills | Status: AC

## 2023-09-30 MED ORDER — OXYCODONE-ACETAMINOPHEN 5-325 MG PO TABS
1.0000 | ORAL_TABLET | Freq: Four times a day (QID) | ORAL | 0 refills | Status: AC | PRN
Start: 2023-09-30 — End: 2023-10-07

## 2023-10-08 ENCOUNTER — Telehealth: Payer: Self-pay | Admitting: Podiatry

## 2023-10-08 ENCOUNTER — Other Ambulatory Visit: Payer: Self-pay | Admitting: Podiatry

## 2023-10-08 DIAGNOSIS — M722 Plantar fascial fibromatosis: Secondary | ICD-10-CM

## 2023-10-08 DIAGNOSIS — M7661 Achilles tendinitis, right leg: Secondary | ICD-10-CM

## 2023-10-08 NOTE — Telephone Encounter (Signed)
 Received fax from [employer] -- the fax sent 09/15/2023 was received incomplete (only 1 of 3 pages) .Marland Kitchen...  Resent the complete 09/25/23 fax again to [employer] -- [phone] ...    J. Abbott -- 10/08/2023.

## 2023-10-09 ENCOUNTER — Ambulatory Visit (INDEPENDENT_AMBULATORY_CARE_PROVIDER_SITE_OTHER): Payer: BC Managed Care – PPO

## 2023-10-09 ENCOUNTER — Encounter: Payer: Self-pay | Admitting: Podiatry

## 2023-10-09 ENCOUNTER — Ambulatory Visit (INDEPENDENT_AMBULATORY_CARE_PROVIDER_SITE_OTHER): Payer: BC Managed Care – PPO | Admitting: Podiatry

## 2023-10-09 DIAGNOSIS — M722 Plantar fascial fibromatosis: Secondary | ICD-10-CM | POA: Diagnosis not present

## 2023-10-09 DIAGNOSIS — M779 Enthesopathy, unspecified: Secondary | ICD-10-CM | POA: Diagnosis not present

## 2023-10-09 DIAGNOSIS — Z9889 Other specified postprocedural states: Secondary | ICD-10-CM | POA: Diagnosis not present

## 2023-10-09 DIAGNOSIS — M7661 Achilles tendinitis, right leg: Secondary | ICD-10-CM

## 2023-10-09 NOTE — Progress Notes (Signed)
  Subjective:  Patient ID: Sandy Rodriguez, female    DOB: [birth year],  MRN: [id number]  No chief complaint on file.   DOS: 09/30/23 Procedure: Right haglund's resection and secondary achilles tendon repair with right plantar fasciotomy   48 y.o. female returns for POV#1. Relates doing well and managing pain  Review of Systems: Negative except as noted in the HPI. Denies N/V/F/Ch.  No past medical history on file.  Current Outpatient Medications:    aspirin  EC 81 MG tablet, Take 1 tablet (81 mg total) by mouth in the morning and at bedtime. Swallow whole., Disp: , Rfl:    ondansetron  (ZOFRAN ) 4 MG tablet, Take 1 tablet (4 mg total) by mouth every 8 (eight) hours as needed for nausea or vomiting., Disp: 20 tablet, Rfl: 0   AMBULATORY NON FORMULARY MEDICATION, LBS(lower bowel stimulator), Disp: , Rfl:    CALCIUM PO, Take 600 mg by mouth daily., Disp: , Rfl:    levonorgestrel  (MIRENA , 52 MG,) 20 MCG/24HR IUD, Mirena  20 mcg/24 hours (6 yrs) 52 mg intrauterine device  Take 1 device by intrauterine route., Disp: , Rfl:    Multiple Vitamin (MULTIVITAMIN) tablet, Take 1 tablet by mouth daily., Disp: , Rfl:    phentermine  (ADIPEX-P ) 37.5 MG tablet, One tab by mouth qAM, Disp: 90 tablet, Rfl: 0  Social History   Tobacco Use  Smoking Status Never  Smokeless Tobacco Never    Allergies  Allergen Reactions   Belviq [Lorcaserin  Hcl]     Makes her feel weird.    Sulfonamide Derivatives    Objective:  There were no vitals filed for this visit. There is no height or weight on file to calculate BMI. Constitutional Well developed. Well nourished.  Vascular Foot warm and well perfused. Capillary refill normal to all digits.   Neurologic Normal speech. Oriented to person, place, and time. Epicritic sensation to light touch grossly present bilaterally.  Dermatologic Skin healing well without signs of infection. Skin edges well coapted without signs of infection.  Orthopedic: Tenderness to  palpation noted about the surgical site.   Radiographs: Interval resection of haglund deformity  Assessment:   1. Post-operative state   2. Tendonitis, Achilles, right   3. Plantar fasciitis, right    Plan:  Patient was evaluated and treated and all questions answered.  S/p foot surgery right -Progressing as expected post-operatively. -WB Status: NWB in CAM boot  -Sutures: itnact. -Medications: n/a -Foot redressed.  Return in 2 weeks for suture removal.   No follow-ups on file.

## 2023-10-16 ENCOUNTER — Telehealth: Payer: Self-pay | Admitting: Podiatry

## 2023-10-16 NOTE — Telephone Encounter (Signed)
Completed paperwork from [employer] for the patient -- is for medical information on (1) condition;  (2) ICD 10, and (3) CPT for SX  . Marland Kitchen...  Faxed to [phone] ....    J. Abbott -- 10/16/2023

## 2023-10-23 ENCOUNTER — Encounter: Payer: Self-pay | Admitting: Podiatry

## 2023-10-23 ENCOUNTER — Ambulatory Visit (INDEPENDENT_AMBULATORY_CARE_PROVIDER_SITE_OTHER): Payer: BC Managed Care – PPO | Admitting: Podiatry

## 2023-10-23 DIAGNOSIS — Z9889 Other specified postprocedural states: Secondary | ICD-10-CM

## 2023-10-23 DIAGNOSIS — M7661 Achilles tendinitis, right leg: Secondary | ICD-10-CM

## 2023-10-23 NOTE — Progress Notes (Signed)
  Subjective:  Patient ID: Sandy Rodriguez, female    DOB: 11-Nov-1975,  MRN: [id number]  Chief Complaint  Patient presents with   Routine Post Op    Pt presents for a follow up POV#2 states she is doing well.    DOS: 09/30/23 Procedure: Right haglund's resection and secondary achilles tendon repair with right plantar fasciotomy   48 y.o. female returns for POV#2. Relates doing well and managing pain  Review of Systems: Negative except as noted in the HPI. Denies N/V/F/Ch.  No past medical history on file.  Current Outpatient Medications:    aspirin EC 81 MG tablet, Take 1 tablet (81 mg total) by mouth in the morning and at bedtime. Swallow whole., Disp: , Rfl:    ondansetron (ZOFRAN) 4 MG tablet, Take 1 tablet (4 mg total) by mouth every 8 (eight) hours as needed for nausea or vomiting., Disp: 20 tablet, Rfl: 0   AMBULATORY NON FORMULARY MEDICATION, LBS(lower bowel stimulator), Disp: , Rfl:    CALCIUM PO, Take 600 mg by mouth daily., Disp: , Rfl:    levonorgestrel (MIRENA, 52 MG,) 20 MCG/24HR IUD, Mirena 20 mcg/24 hours (6 yrs) 52 mg intrauterine device  Take 1 device by intrauterine route., Disp: , Rfl:    Multiple Vitamin (MULTIVITAMIN) tablet, Take 1 tablet by mouth daily., Disp: , Rfl:    phentermine (ADIPEX-P) 37.5 MG tablet, One tab by mouth qAM, Disp: 90 tablet, Rfl: 0  Social History   Tobacco Use  Smoking Status Never  Smokeless Tobacco Never    Allergies  Allergen Reactions   Belviq [Lorcaserin Hcl]     Makes her feel weird.    Sulfonamide Derivatives    Objective:  There were no vitals filed for this visit. There is no height or weight on file to calculate BMI. Constitutional Well developed. Well nourished.  Vascular Foot warm and well perfused. Capillary refill normal to all digits.   Neurologic Normal speech. Oriented to person, place, and time. Epicritic sensation to light touch grossly present bilaterally.  Dermatologic Skin healing well without signs of  infection. Skin edges well coapted without signs of infection.  Orthopedic: Tenderness to palpation noted about the surgical site.   Radiographs: Interval resection of haglund deformity  Assessment:   No diagnosis found.  Plan:  Patient was evaluated and treated and all questions answered.  S/p foot surgery right -Progressing as expected post-operatively. -WB Status: NWB in CAM boot for one more week than may begin WBAT in CAM boot  -Sutures: removed without incident.  -Medications: n/a -Foot redressed.  Return in 3 weeks for recheck  No follow-ups on file.

## 2023-11-14 ENCOUNTER — Ambulatory Visit (INDEPENDENT_AMBULATORY_CARE_PROVIDER_SITE_OTHER): Payer: BC Managed Care – PPO | Admitting: Podiatry

## 2023-11-14 ENCOUNTER — Encounter: Payer: Self-pay | Admitting: Podiatry

## 2023-11-14 DIAGNOSIS — M7661 Achilles tendinitis, right leg: Secondary | ICD-10-CM

## 2023-11-14 DIAGNOSIS — Z9889 Other specified postprocedural states: Secondary | ICD-10-CM

## 2023-11-14 NOTE — Progress Notes (Signed)
  Subjective:  Patient ID: Sandy Rodriguez, female    DOB: [birth year],  MRN: [id number]  No chief complaint on file.   DOS: 09/30/23 Procedure: Right haglund's resection and secondary achilles tendon repair with right plantar fasciotomy   48 y.o. female returns for POV#3. Relates doing well and managing pain. Has been walking in the boot.   Review of Systems: Negative except as noted in the HPI. Denies N/V/F/Ch.  No past medical history on file.  Current Outpatient Medications:    ondansetron (ZOFRAN) 4 MG tablet, Take 1 tablet (4 mg total) by mouth every 8 (eight) hours as needed for nausea or vomiting., Disp: 20 tablet, Rfl: 0   AMBULATORY NON FORMULARY MEDICATION, LBS(lower bowel stimulator), Disp: , Rfl:    CALCIUM PO, Take 600 mg by mouth daily., Disp: , Rfl:    levonorgestrel (MIRENA, 52 MG,) 20 MCG/24HR IUD, Mirena 20 mcg/24 hours (6 yrs) 52 mg intrauterine device  Take 1 device by intrauterine route., Disp: , Rfl:    Multiple Vitamin (MULTIVITAMIN) tablet, Take 1 tablet by mouth daily., Disp: , Rfl:    phentermine (ADIPEX-P) 37.5 MG tablet, One tab by mouth qAM, Disp: 90 tablet, Rfl: 0  Social History   Tobacco Use  Smoking Status Never  Smokeless Tobacco Never    Allergies  Allergen Reactions   Belviq [Lorcaserin Hcl]     Makes her feel weird.    Sulfonamide Derivatives    Objective:  There were no vitals filed for this visit. There is no height or weight on file to calculate BMI. Constitutional Well developed. Well nourished.  Vascular Foot warm and well perfused. Capillary refill normal to all digits.   Neurologic Normal speech. Oriented to person, place, and time. Epicritic sensation to light touch grossly present bilaterally.  Dermatologic Skin healing well without signs of infection. Skin edges well coapted without signs of infection.  Orthopedic: Tenderness to palpation noted about the surgical site.   Radiographs: Interval resection of haglund deformity   Assessment:   1. Post-operative state   2. Tendonitis, Achilles, right     Plan:  Patient was evaluated and treated and all questions answered.  S/p foot surgery right -Progressing as expected post-operatively. -WB Status: May begin transitioning to [facility] in regular shoe.  -Medications: n/a   Return in 6 weeks for recheck  No follow-ups on file.

## 2023-12-10 ENCOUNTER — Encounter: Payer: Self-pay | Admitting: Podiatry

## 2023-12-26 ENCOUNTER — Encounter: Payer: Self-pay | Admitting: Podiatry

## 2023-12-26 ENCOUNTER — Ambulatory Visit (INDEPENDENT_AMBULATORY_CARE_PROVIDER_SITE_OTHER): Payer: BC Managed Care – PPO | Admitting: Podiatry

## 2023-12-26 DIAGNOSIS — M722 Plantar fascial fibromatosis: Secondary | ICD-10-CM

## 2023-12-26 DIAGNOSIS — M7661 Achilles tendinitis, right leg: Secondary | ICD-10-CM

## 2023-12-26 DIAGNOSIS — Z9889 Other specified postprocedural states: Secondary | ICD-10-CM

## 2023-12-26 NOTE — Progress Notes (Signed)
  Subjective:  Patient ID: Sandy Rodriguez, female    DOB: [birth year],  MRN: [id number]  No chief complaint on file.   DOS: 09/30/23 Procedure: Right haglund's resection and secondary achilles tendon repair with right plantar fasciotomy   48 y.o. female returns for POV#4. Relates doing well and managing pain. Has been walking in regular shoe. Relates she is doing well and not having much pain but does feel like her gait is still not perfect.   Review of Systems: Negative except as noted in the HPI. Denies N/V/F/Ch.  History reviewed. No pertinent past medical history.  Current Outpatient Medications:    ondansetron (ZOFRAN) 4 MG tablet, Take 1 tablet (4 mg total) by mouth every 8 (eight) hours as needed for nausea or vomiting., Disp: 20 tablet, Rfl: 0   AMBULATORY NON FORMULARY MEDICATION, LBS(lower bowel stimulator), Disp: , Rfl:    CALCIUM PO, Take 600 mg by mouth daily., Disp: , Rfl:    levonorgestrel (MIRENA, 52 MG,) 20 MCG/24HR IUD, Mirena 20 mcg/24 hours (6 yrs) 52 mg intrauterine device  Take 1 device by intrauterine route., Disp: , Rfl:    Multiple Vitamin (MULTIVITAMIN) tablet, Take 1 tablet by mouth daily., Disp: , Rfl:    phentermine (ADIPEX-P) 37.5 MG tablet, One tab by mouth qAM, Disp: 90 tablet, Rfl: 0  Social History   Tobacco Use  Smoking Status Never  Smokeless Tobacco Never    Allergies  Allergen Reactions   Belviq [Lorcaserin Hcl]     Makes her feel weird.    Sulfonamide Derivatives    Objective:  There were no vitals filed for this visit. There is no height or weight on file to calculate BMI. Constitutional Well developed. Well nourished.  Vascular Foot warm and well perfused. Capillary refill normal to all digits.   Neurologic Normal speech. Oriented to person, place, and time. Epicritic sensation to light touch grossly present bilaterally.  Dermatologic Skin healing well without signs of infection. Skin edges well coapted without signs of infection.   Orthopedic: Tenderness to palpation noted about the surgical site.   Radiographs: Interval resection of haglund deformity  Assessment:   1. Post-operative state   2. Tendonitis, Achilles, right   3. Plantar fasciitis, right      Plan:  Patient was evaluated and treated and all questions answered.  S/p foot surgery right -Progressing as expected post-operatively. -WB Status: WBAT in regular shoe.  -Medications: n/a -Referral to PT to work on gait and strength in foot.    Return in 6 weeks for recheck  Return in about 2 months (around 02/25/2024) for post op.

## 2024-01-22 ENCOUNTER — Ambulatory Visit: Attending: Podiatry | Admitting: Physical Therapy

## 2024-01-22 ENCOUNTER — Encounter: Payer: Self-pay | Admitting: Physical Therapy

## 2024-01-22 ENCOUNTER — Other Ambulatory Visit: Payer: Self-pay

## 2024-01-22 DIAGNOSIS — M7661 Achilles tendinitis, right leg: Secondary | ICD-10-CM | POA: Diagnosis not present

## 2024-01-22 DIAGNOSIS — M722 Plantar fascial fibromatosis: Secondary | ICD-10-CM | POA: Insufficient documentation

## 2024-01-22 DIAGNOSIS — Z9889 Other specified postprocedural states: Secondary | ICD-10-CM | POA: Insufficient documentation

## 2024-01-22 DIAGNOSIS — R262 Difficulty in walking, not elsewhere classified: Secondary | ICD-10-CM | POA: Insufficient documentation

## 2024-01-22 NOTE — Therapy (Signed)
 OUTPATIENT PHYSICAL THERAPY LOWER EXTREMITY EVALUATION   Patient Name: Sandy Rodriguez MRN: [id number] DOB:25-Jul-1976, 48 y.o., female Today's Date: 01/22/2024  END OF SESSION:  PT End of Session - 01/22/24 0919     Visit Number 1    Number of Visits 16    Date for PT Re-Evaluation 03/18/24    Authorization Type BCBS    Authorization - Number of Visits 30    PT Start Time 0845    PT Stop Time 0920    PT Time Calculation (min) 35 min    Activity Tolerance Patient tolerated treatment well    Behavior During Therapy [facility] for tasks assessed/performed             History reviewed. No pertinent past medical history. Past Surgical History:  Procedure Laterality Date   BREAST REDUCTION SURGERY  09/30/1996   CHOLECYSTECTOMY     REDUCTION MAMMAPLASTY     Patient Active Problem List   Diagnosis Date Noted   Left lumbar radiculitis 12/20/2022   Acute cough 09/27/2022   Viral upper respiratory tract infection 09/27/2022   Pilar cyst 03/08/2020   Callus of heel 04/13/2019   Sinus tarsitis, right 12/04/2018   Retrocalcaneal bursitis, right 12/12/2017   Pain at the base of the right fifth metatarsal 12/12/2017   Ankle pain, left 12/12/2017   Gastrocnemius muscle strain, right, initial encounter 11/06/2017   Onychomycosis 11/20/2016   Allergic sinusitis 06/14/2015   Class 1 obesity due to excess calories without serious comorbidity with body mass index (BMI) of 33.0 to 33.9 in adult 06/14/2015   Abnormal weight gain 06/22/2014   Constipation 06/22/2014   SKIN TAG 04/09/2010    PCP: Lindaann Requena  REFERRING PROVIDER: Alvah Auerbach  REFERRING DIAG: Rt achilles tendonitis  THERAPY DIAG:  Difficulty in walking, not elsewhere classified  Rationale for Evaluation and Treatment: Rehabilitation  ONSET DATE: 09/30/23  SUBJECTIVE:   SUBJECTIVE STATEMENT: Pt is s/p right haglund's resection and secondary achilles tendon repair with right plantar fasciotomy on 09/30/23. She states she was  initially NWB then progressed to wt bearing with crutches and wore a boot until March. She states that she still has difficulty going down stairs and that her gait doesn't feel "quite right". She does have some Rt ankle soreness with prolonged activity and she feels like her Rt ankle is "stiff".  PERTINENT HISTORY: Plantar fasciitis PAIN:  Are you having pain? Yes: NPRS scale: 0/10 currently, 2/10 at worst Pain location: Rt ankle/foot Pain description: stiff Aggravating factors: prolonged activity Relieving factors: compression sock, rest  PRECAUTIONS: None  RED FLAGS: None   WEIGHT BEARING RESTRICTIONS: No  FALLS:  Has patient fallen in last 6 months? No   OCCUPATION: works for [employer] at home  PLOF: Independent  PATIENT GOALS: be able to go down stairs without railing, walk faster  NEXT MD VISIT: 02/27/24  OBJECTIVE:  Note: Objective measures were completed at Evaluation unless otherwise noted.    PATIENT SURVEYS:  Patient specific functional scale: descending stairs = 5   PALPATION: Hypomobile TC and interphalangeal mobs Rt Mild TTP around incision on achilles Mild edema noted  LOWER EXTREMITY ROM:  Active ROM Right eval Left eval  Hip flexion    Hip extension    Hip abduction    Hip adduction    Hip internal rotation    Hip external rotation    Knee flexion    Knee extension    Ankle dorsiflexion 2 5  Ankle plantarflexion 55 71  Ankle inversion  18 40  Ankle eversion 11 30   (Blank rows = not tested)  LOWER EXTREMITY MMT:  MMT Right eval Left eval  Hip flexion    Hip extension    Hip abduction    Hip adduction    Hip internal rotation    Hip external rotation    Knee flexion    Knee extension    Ankle dorsiflexion 4+ 5  Ankle plantarflexion 3- 5  Ankle inversion 4+ 5  Ankle eversion 4 5   (Blank rows = not tested)    FUNCTIONAL TESTS:  SLS > 15 sec bilat Pt unable to perform SL heel raise on Rt. Unable to perform eccentric heel  lowering due to pain  GAIT:  Comments: decreased Rt DF during gait Pain with descending stairs leading with Rt foot - requires railing to descend stairs                                                                                                                                TREATMENT DATE: 01/22/24 See HEP Pt educated on PT POC and goals, HEP, rationale for treatment, Relevant anatomy    PATIENT EDUCATION:  Education details: PT POC and goals, HEP Person educated: Patient Education method: Explanation, Demonstration, and Handouts Education comprehension: verbalized understanding and returned demonstration  HOME EXERCISE PROGRAM: Access Code: [email or web address] URL: [email or web address] Date: 01/22/2024 Prepared by: Lowery Rue  Exercises - Gastroc Stretch on Wall  - 3-5 x daily - 7 x weekly - 1 sets - 3 reps - 20-30 seconds hold - Soleus Stretch on Wall  - 3-5 x daily - 7 x weekly - 1 sets - 3 reps - 20-30 seconds hold - Standing Heel Raise with Support  - 1 x daily - 7 x weekly - 2 sets - 10 reps - Standing Weight Shift Side to Side  - 1 x daily - 7 x weekly - 2 sets - 10 reps  ASSESSMENT:  CLINICAL IMPRESSION: Patient is a 48 y.o. female who was seen today for physical therapy evaluation and treatment for Rt achilles tendonitis s/p right haglund's resection and secondary achilles tendon repair with right plantar fasciotomy. She presents with decreased ROM and strength, impaired gait and decreased functional mobility. Pt will benefit from skilled PT to address deficits and improve functional abilities in gait and stair negotiation.   OBJECTIVE IMPAIRMENTS: decreased activity tolerance, difficulty walking, decreased ROM, decreased strength, impaired flexibility, and pain.   ACTIVITY LIMITATIONS: stairs and locomotion level  PARTICIPATION LIMITATIONS: yard work  PERSONAL FACTORS: Time since onset of injury/illness/exacerbation are also affecting patient's  functional outcome.   REHAB POTENTIAL: Good  CLINICAL DECISION MAKING: Stable/uncomplicated  EVALUATION COMPLEXITY: Low   GOALS: Goals reviewed with patient? Yes  SHORT TERM GOALS: Target date: 02/19/2024    Pt will be independent in initial HEP Baseline: Goal status: INITIAL  2.  Pt will improve DF range on Rt ankle to >= 5 degrees  Baseline:  Goal status: INITIAL    LONG TERM GOALS: Target date: 03/18/2024    Pt will be independent with advanced HEP Baseline:  Goal status: INITIAL  2.  Pt will improve PSFS to <= 1 to demo improved functional mobility Baseline:  Goal status: INITIAL  3.  Pt will demo 4/5 PF strength on Rt LE Baseline:  Goal status: INITIAL  4.  Pt will tolerate descending a flight of stairs in a reciprocal pattern without railings with pain <= 1/10 Baseline:  Goal status: INITIAL     PLAN:  PT FREQUENCY: 1-2x/week  PT DURATION: 8 weeks  PLANNED INTERVENTIONS: 97164- PT Re-evaluation, 97110-Therapeutic exercises, 97530- Therapeutic activity, [id number]- Neuromuscular re-education, 97535- Self Care, 91478- Manual therapy, [id number]- Gait training, [phone]- Aquatic Therapy, [id number]- Electrical stimulation (unattended), 97016- Vasopneumatic device, [id number]- Ultrasound, [id number]- Ionotophoresis 4mg /ml Dexamethasone , Patient/Family education, Balance training, Stair training, Taping, Dry Needling, Cryotherapy, and Moist heat  PLAN FOR NEXT SESSION: PERFORM TUG AND ADD GOAL, assess response to HEP, ankle mobility and strength, stairs   Meris Reede, PT 01/22/2024, 9:20 AM  For all possible CPT codes, reference the Planned Interventions line above.     Check all conditions that are expected to impact treatment: {Conditions expected to impact treatment:None of these apply   If treatment provided at initial evaluation, no treatment charged due to lack of authorization.

## 2024-01-27 ENCOUNTER — Ambulatory Visit

## 2024-01-30 ENCOUNTER — Encounter: Admitting: Physical Therapy

## 2024-02-02 ENCOUNTER — Encounter: Admitting: Physician Assistant

## 2024-02-03 ENCOUNTER — Encounter: Admitting: Physician Assistant

## 2024-02-03 ENCOUNTER — Ambulatory Visit: Attending: Podiatry

## 2024-02-03 DIAGNOSIS — R293 Abnormal posture: Secondary | ICD-10-CM | POA: Diagnosis not present

## 2024-02-03 DIAGNOSIS — I69918 Other symptoms and signs involving cognitive functions following unspecified cerebrovascular disease: Secondary | ICD-10-CM | POA: Diagnosis not present

## 2024-02-03 DIAGNOSIS — R200 Anesthesia of skin: Secondary | ICD-10-CM | POA: Diagnosis not present

## 2024-02-03 DIAGNOSIS — R262 Difficulty in walking, not elsewhere classified: Secondary | ICD-10-CM | POA: Insufficient documentation

## 2024-02-03 NOTE — Therapy (Signed)
 OUTPATIENT PHYSICAL THERAPY LOWER EXTREMITY TREATMENT   Patient Name: Sandy Rodriguez MRN: [id number] DOB:1975/11/29, 48 y.o., female Today's Date: 02/03/2024  END OF SESSION:  PT End of Session - 02/03/24 0840     Visit Number 2    Number of Visits 16    Date for PT Re-Evaluation 03/18/24    Authorization Type BCBS    Authorization - Visit Number 2    Authorization - Number of Visits 30    PT Start Time [phone]    PT Stop Time 0927    PT Time Calculation (min) 45 min    Activity Tolerance Patient tolerated treatment well    Behavior During Therapy [facility] for tasks assessed/performed            History reviewed. No pertinent past medical history. Past Surgical History:  Procedure Laterality Date   BREAST REDUCTION SURGERY  09/30/1996   CHOLECYSTECTOMY     REDUCTION MAMMAPLASTY     Patient Active Problem List   Diagnosis Date Noted   Left lumbar radiculitis 12/20/2022   Acute cough 09/27/2022   Viral upper respiratory tract infection 09/27/2022   Pilar cyst 03/08/2020   Callus of heel 04/13/2019   Sinus tarsitis, right 12/04/2018   Retrocalcaneal bursitis, right 12/12/2017   Pain at the base of the right fifth metatarsal 12/12/2017   Ankle pain, left 12/12/2017   Gastrocnemius muscle strain, right, initial encounter 11/06/2017   Onychomycosis 11/20/2016   Allergic sinusitis 06/14/2015   Class 1 obesity due to excess calories without serious comorbidity with body mass index (BMI) of 33.0 to 33.9 in adult 06/14/2015   Abnormal weight gain 06/22/2014   Constipation 06/22/2014   SKIN TAG 04/09/2010    PCP: Lindaann Requena  REFERRING PROVIDER: Alvah Auerbach  REFERRING DIAG: Rt achilles tendonitis  THERAPY DIAG:  Difficulty in walking, not elsewhere classified  Numbness of left foot  Other symptoms and signs involving cognitive functions following unspecified cerebrovascular disease  Abnormal posture  Rationale for Evaluation and Treatment: Rehabilitation  ONSET DATE:  09/30/23  SUBJECTIVE:   SUBJECTIVE STATEMENT: Patient reports she did HEP and they feel fine, no pain. Patient states she ices ankles almost everyday. Patient states she has pain when going down stairs along achilles.   Pt is s/p right haglund's resection and secondary achilles tendon repair with right plantar fasciotomy on 09/30/23. She states she was initially NWB then progressed to wt bearing with crutches and wore a boot until March. She states that she still has difficulty going down stairs and that her gait doesn't feel "quite right". She does have some Rt ankle soreness with prolonged activity and she feels like her Rt ankle is "stiff".  PERTINENT HISTORY: Plantar fasciitis PAIN:  Are you having pain? Yes: NPRS scale: 0/10 currently, 2/10 at worst Pain location: Rt ankle/foot Pain description: stiff Aggravating factors: prolonged activity Relieving factors: compression sock, rest  PRECAUTIONS: None  RED FLAGS: None   WEIGHT BEARING RESTRICTIONS: No  FALLS:  Has patient fallen in last 6 months? No   OCCUPATION: works for [employer] at home  PLOF: Independent  PATIENT GOALS: be able to go down stairs without railing, walk faster  NEXT MD VISIT: 02/27/24  OBJECTIVE:  Note: Objective measures were completed at Evaluation unless otherwise noted.    PATIENT SURVEYS:  Patient specific functional scale: descending stairs = 5   PALPATION: Hypomobile TC and interphalangeal mobs Rt Mild TTP around incision on achilles Mild edema noted  LOWER EXTREMITY ROM:  Active ROM Right  eval Left eval  Hip flexion    Hip extension    Hip abduction    Hip adduction    Hip internal rotation    Hip external rotation    Knee flexion    Knee extension    Ankle dorsiflexion 2 5  Ankle plantarflexion 55 71  Ankle inversion 18 40  Ankle eversion 11 30   (Blank rows = not tested)  LOWER EXTREMITY MMT:  MMT Right eval Left eval  Hip flexion    Hip extension    Hip abduction     Hip adduction    Hip internal rotation    Hip external rotation    Knee flexion    Knee extension    Ankle dorsiflexion 4+ 5  Ankle plantarflexion 3- 5  Ankle inversion 4+ 5  Ankle eversion 4 5   (Blank rows = not tested)    FUNCTIONAL TESTS:  SLS > 15 sec bilat Pt unable to perform SL heel raise on Rt. Unable to perform eccentric heel lowering due to pain  TUG: 8 seconds  GAIT:  Comments: decreased Rt DF during gait Pain with descending stairs leading with Rt foot - requires railing to descend stairs    [facility] Adult PT Treatment:                                                DATE: 02/03/2024 Therapeutic Exercise: Seated, foot on towel: Alt towel scrunch + toe splay Toe yoga progression Ankle EV/INV AROM  Great toe extension stretch 5x10" Long sitting gastroc & soleus stretches with towel 5x10" Standing gastroc stretch Side Lying: Bent knee hip abd + GTB x10 Clamshells + GTB x10 Hip abduction & extension on diagonal + GTB x10 Neuromuscular re-ed: BAPS board --> ankle DF/PF, EV/INV, circles CW/CCW Standing foot doming 10x5" Heel raises + tennis ball b/w ankles Rt single leg balance x 30" 6" runner's step up --> fingertip support PRN Therapeutic Activity: TUG (8 sec) Staggered stance weight shifting --> focus on Rt great toe extension tolerance Modalities: Ice massage at home as needed base of achilles                                                                                                                               TREATMENT DATE: 01/22/24 See HEP Pt educated on PT POC and goals, HEP, rationale for treatment, Relevant anatomy    PATIENT EDUCATION:  Education details: PT POC and goals, HEP Person educated: Patient Education method: Explanation, Demonstration, and Handouts Education comprehension: verbalized understanding and returned demonstration  HOME EXERCISE PROGRAM: Access Code: [email or web address] URL: [email or web address] Date:  01/22/2024 Prepared by: Lowery Rue  Exercises - Gastroc Stretch on Wall  - 3-5 x daily - 7 x weekly - 1 sets - 3 reps - 20-30 seconds hold -  Soleus Stretch on Wall  - 3-5 x daily - 7 x weekly - 1 sets - 3 reps - 20-30 seconds hold - Standing Heel Raise with Support  - 1 x daily - 7 x weekly - 2 sets - 10 reps - Standing Weight Shift Side to Side  - 1 x daily - 7 x weekly - 2 sets - 10 reps  ASSESSMENT:  CLINICAL IMPRESSION: Ankle mobility and intrinsic foot strengthening progressed as tolerated. Single leg stability exercises incorporated to challenge stability and ankle strategy mechanics. Hip strengthening exercises added with focus on hip abduction variations.   EVAL: Patient is a 48 y.o. female who was seen today for physical therapy evaluation and treatment for Rt achilles tendonitis s/p right haglund's resection and secondary achilles tendon repair with right plantar fasciotomy. She presents with decreased ROM and strength, impaired gait and decreased functional mobility. Pt will benefit from skilled PT to address deficits and improve functional abilities in gait and stair negotiation.   OBJECTIVE IMPAIRMENTS: decreased activity tolerance, difficulty walking, decreased ROM, decreased strength, impaired flexibility, and pain.   ACTIVITY LIMITATIONS: stairs and locomotion level  PARTICIPATION LIMITATIONS: yard work  PERSONAL FACTORS: Time since onset of injury/illness/exacerbation are also affecting patient's functional outcome.   REHAB POTENTIAL: Good  CLINICAL DECISION MAKING: Stable/uncomplicated  EVALUATION COMPLEXITY: Low   GOALS: Goals reviewed with patient? Yes  SHORT TERM GOALS: Target date: 02/19/2024  Pt will be independent in initial HEP Baseline: Goal status: INITIAL  2.  Pt will improve DF range on Rt ankle to >= 5 degrees Baseline:  Goal status: INITIAL    LONG TERM GOALS: Target date: 03/18/2024  Pt will be independent with advanced HEP Baseline:   Goal status: INITIAL  2.  Pt will improve PSFS to <= 1 to demo improved functional mobility Baseline:  Goal status: INITIAL  3.  Pt will demo 4/5 PF strength on Rt LE Baseline:  Goal status: INITIAL  4.  Pt will tolerate descending a flight of stairs in a reciprocal pattern without railings with pain <= 1/10 Baseline:  Goal status: INITIAL     PLAN:  PT FREQUENCY: 1-2x/week  PT DURATION: 8 weeks  PLANNED INTERVENTIONS: 97164- PT Re-evaluation, 97110-Therapeutic exercises, 97530- Therapeutic activity, [id number]- Neuromuscular re-education, 97535- Self Care, [id number]- Manual therapy, [id number]- Gait training, [phone]- Aquatic Therapy, [phone]- Electrical stimulation (unattended), 97016- Vasopneumatic device, [id number]- Ultrasound, 95621- Ionotophoresis 4mg /ml Dexamethasone , Patient/Family education, Balance training, Stair training, Taping, Dry Needling, Cryotherapy, and Moist heat  PLAN FOR NEXT SESSION: Follow-up on new HEP, ankle mobility and strength, stairs   Flint Hummer, PTA 02/03/2024, 9:29 AM

## 2024-02-06 ENCOUNTER — Ambulatory Visit

## 2024-02-06 ENCOUNTER — Ambulatory Visit (INDEPENDENT_AMBULATORY_CARE_PROVIDER_SITE_OTHER): Admitting: Physician Assistant

## 2024-02-06 ENCOUNTER — Encounter: Payer: Self-pay | Admitting: Physician Assistant

## 2024-02-06 VITALS — BP 120/70 | HR 95 | Temp 97.6°F | Ht 66.0 in | Wt 230.0 lb

## 2024-02-06 DIAGNOSIS — R200 Anesthesia of skin: Secondary | ICD-10-CM | POA: Diagnosis not present

## 2024-02-06 DIAGNOSIS — R262 Difficulty in walking, not elsewhere classified: Secondary | ICD-10-CM

## 2024-02-06 DIAGNOSIS — E66812 Obesity, class 2: Secondary | ICD-10-CM | POA: Diagnosis not present

## 2024-02-06 DIAGNOSIS — Z0000 Encounter for general adult medical examination without abnormal findings: Secondary | ICD-10-CM

## 2024-02-06 DIAGNOSIS — E6609 Other obesity due to excess calories: Secondary | ICD-10-CM

## 2024-02-06 DIAGNOSIS — Z13220 Encounter for screening for lipoid disorders: Secondary | ICD-10-CM

## 2024-02-06 DIAGNOSIS — I69918 Other symptoms and signs involving cognitive functions following unspecified cerebrovascular disease: Secondary | ICD-10-CM

## 2024-02-06 DIAGNOSIS — Z131 Encounter for screening for diabetes mellitus: Secondary | ICD-10-CM

## 2024-02-06 DIAGNOSIS — R293 Abnormal posture: Secondary | ICD-10-CM | POA: Diagnosis not present

## 2024-02-06 DIAGNOSIS — Z6837 Body mass index (BMI) 37.0-37.9, adult: Secondary | ICD-10-CM

## 2024-02-06 NOTE — Therapy (Signed)
 OUTPATIENT PHYSICAL THERAPY LOWER EXTREMITY TREATMENT   Patient Name: Sandy Rodriguez MRN: [id number] DOB:1975-12-26, 48 y.o., female Today's Date: 02/06/2024  END OF SESSION:  PT End of Session - 02/06/24 0803     Visit Number 3    Number of Visits 16    Date for PT Re-Evaluation 03/18/24    Authorization Type BC[employer]    Authorization - Visit Number 3    Authorization - Number of Visits 30    PT Start Time 0803    PT Stop Time 0843    PT Time Calculation (min) 40 min    Activity Tolerance Patient tolerated treatment well    Behavior During Therapy [facility] for tasks assessed/performed            History reviewed. No pertinent past medical history. Past Surgical History:  Procedure Laterality Date   BREAST REDUCTION SURGERY  09/30/1996   CHOLECYSTECTOMY     REDUCTION MAMMAPLASTY     Patient Active Problem List   Diagnosis Date Noted   Left lumbar radiculitis 12/20/2022   Acute cough 09/27/2022   Viral upper respiratory tract infection 09/27/2022   Pilar cyst 03/08/2020   Callus of heel 04/13/2019   Sinus tarsitis, right 12/04/2018   Retrocalcaneal bursitis, right 12/12/2017   Pain at the base of the right fifth metatarsal 12/12/2017   Ankle pain, left 12/12/2017   Gastrocnemius muscle strain, right, initial encounter 11/06/2017   Onychomycosis 11/20/2016   Allergic sinusitis 06/14/2015   Class 1 obesity due to excess calories without serious comorbidity with body mass index (BMI) of 33.0 to 33.9 in adult 06/14/2015   Abnormal weight gain 06/22/2014   Constipation 06/22/2014   SKIN TAG 04/09/2010    PCP: Lindaann Requena  REFERRING PROVIDER: Alvah Auerbach  REFERRING DIAG: Rt achilles tendonitis  THERAPY DIAG:  Difficulty in walking, not elsewhere classified  Numbness of left foot  Other symptoms and signs involving cognitive functions following unspecified cerebrovascular disease  Abnormal posture  Rationale for Evaluation and Treatment: Rehabilitation  ONSET DATE:  09/30/23  SUBJECTIVE:   SUBJECTIVE STATEMENT: Patient reports she is sore at back of ankle; states she walked barefoot some at home but her ankle began to hurt and put shoes back and felt better.   Pt is s/p right haglund's resection and secondary achilles tendon repair with right plantar fasciotomy on 09/30/23. She states she was initially NWB then progressed to wt bearing with crutches and wore a boot until March. She states that she still has difficulty going down stairs and that her gait doesn't feel "quite right". She does have some Rt ankle soreness with prolonged activity and she feels like her Rt ankle is "stiff".  PERTINENT HISTORY: Plantar fasciitis PAIN:  Are you having pain? Yes: NPRS scale: 0/10 currently, 2/10 at worst Pain location: Rt ankle/foot Pain description: stiff Aggravating factors: prolonged activity Relieving factors: compression sock, rest  PRECAUTIONS: None  RED FLAGS: None   WEIGHT BEARING RESTRICTIONS: No  FALLS:  Has patient fallen in last 6 months? No   OCCUPATION: works for [employer] at home  PLOF: Independent  PATIENT GOALS: be able to go down stairs without railing, walk faster  NEXT MD VISIT: 02/27/24  OBJECTIVE:  Note: Objective measures were completed at Evaluation unless otherwise noted.    PATIENT SURVEYS:  Patient specific functional scale: descending stairs = 5   PALPATION: Hypomobile TC and interphalangeal mobs Rt Mild TTP around incision on achilles Mild edema noted  LOWER EXTREMITY ROM:  Active ROM Right  eval Left eval  Hip flexion    Hip extension    Hip abduction    Hip adduction    Hip internal rotation    Hip external rotation    Knee flexion    Knee extension    Ankle dorsiflexion 2 5  Ankle plantarflexion 55 71  Ankle inversion 18 40  Ankle eversion 11 30   (Blank rows = not tested)  LOWER EXTREMITY MMT:  MMT Right eval Left eval  Hip flexion    Hip extension    Hip abduction    Hip adduction     Hip internal rotation    Hip external rotation    Knee flexion    Knee extension    Ankle dorsiflexion 4+ 5  Ankle plantarflexion 3- 5  Ankle inversion 4+ 5  Ankle eversion 4 5   (Blank rows = not tested)    FUNCTIONAL TESTS:  SLS > 15 sec bilat Pt unable to perform SL heel raise on Rt. Unable to perform eccentric heel lowering due to pain  TUG: 8 seconds  GAIT:  Comments: decreased Rt DF during gait Pain with descending stairs leading with Rt foot - requires railing to descend stairs   [facility] Adult PT Treatment:                                                DATE: 02/06/2024 Therapeutic Exercise: Seated:  Rocker board --> ankle DF/PF, EV/INV Alt heel/toe raises Towel scrunch Toe yoga Marble pick up --> individual toe Long sitting gastroc & soleus stretches with towel 3x30" each Plantar fascia stretch 5x10" Standing --> fascial massage plantar surface Rt foot with tennis ball Neuromuscular re-ed: Barefoot: Standing foot doming  Staggered stance fwd weight shift on R LE + dynamic arch lift x10 Heel raises + tennis ball b/w ankles x 5 Eccentric heel raises x10 Step down 6" step --> stepping down with Lt, bilateral railing support   [facility] Adult PT Treatment:                                                DATE: 02/03/2024 Therapeutic Exercise: Seated, foot on towel: Alt towel scrunch + toe splay Toe yoga progression Ankle EV/INV AROM  Great toe extension stretch 5x10" Long sitting gastroc & soleus stretches with towel 5x10" Standing gastroc stretch Side Lying: Bent knee hip abd + GTB x10 Clamshells + GTB x10 Hip abduction & extension on diagonal + GTB x10 Neuromuscular re-ed: BAPS board --> ankle DF/PF, EV/INV, circles CW/CCW Standing foot doming 10x5" Heel raises + tennis ball b/w ankles Rt single leg balance x 30" 6" runner's step up --> fingertip support PRN Therapeutic Activity: TUG (8 sec) Staggered stance weight shifting --> focus on Rt great toe extension  tolerance Modalities: Ice massage at home as needed base of achilles  TREATMENT DATE: 01/22/24 See HEP Pt educated on PT POC and goals, HEP, rationale for treatment, Relevant anatomy    PATIENT EDUCATION:  Education details: PT POC and goals, HEP Person educated: Patient Education method: Explanation, Demonstration, and Handouts Education comprehension: verbalized understanding and returned demonstration  HOME EXERCISE PROGRAM: Access Code: [email or web address] URL: [email or web address] Date: 02/06/2024 Prepared by: Sims Duck  Exercises - Gastroc Stretch on Wall  - 3-5 x daily - 7 x weekly - 1 sets - 3 reps - 20-30 seconds hold - Soleus Stretch on Wall  - 3-5 x daily - 7 x weekly - 1 sets - 3 reps - 20-30 seconds hold - Standing Heel Raise with Support  - 1 x daily - 7 x weekly - 2 sets - 10 reps - Standing Weight Shift Side to Side  - 1 x daily - 7 x weekly - 2 sets - 10 reps - Toe Yoga - Alternating Great Toe and Lesser Toe Extension  - 1 x daily - 7 x weekly - 1-3 sets - 10 reps - Seated Self Great Toe Stretch  - 1 x daily - 7 x weekly - 3 sets - 10 reps - Towel Scrunches  - 1 x daily - 7 x weekly - 3 sets - 10 reps - Stride Stance Weight Shift  - 1 x daily - 7 x weekly - 3 sets - 10 reps - Runner's Step Up/Down  - 1 x daily - 7 x weekly - 3 sets - 10 reps - Sidelying Bent Knee Lift at 45 Degrees  - 1 x daily - 7 x weekly - 3 sets - 10 reps - Clam with Resistance  - 1 x daily - 7 x weekly - 3 sets - 10 reps - Sidelying Hip Abduction and Extension with Loop Band  - 1 x daily - 7 x weekly - 3 sets - 10 reps  ASSESSMENT:  CLINICAL IMPRESSION: New HEP reviewed provided cueing as needed for proper form and alignment. Intrinsic foot strengthening continued and barefoot exercises incorporated to tolerance. Eccentric heel raises added to ankle mobility  and strengthening. Fascial self-massage added to plantar surface of foot to progress tolerance with weight bearing and decrease myofascial tightness.  EVAL: Patient is a 48 y.o. female who was seen today for physical therapy evaluation and treatment for Rt achilles tendonitis s/p right haglund's resection and secondary achilles tendon repair with right plantar fasciotomy. She presents with decreased ROM and strength, impaired gait and decreased functional mobility. Pt will benefit from skilled PT to address deficits and improve functional abilities in gait and stair negotiation.   OBJECTIVE IMPAIRMENTS: decreased activity tolerance, difficulty walking, decreased ROM, decreased strength, impaired flexibility, and pain.   ACTIVITY LIMITATIONS: stairs and locomotion level  PARTICIPATION LIMITATIONS: yard work  PERSONAL FACTORS: Time since onset of injury/illness/exacerbation are also affecting patient's functional outcome.   REHAB POTENTIAL: Good  CLINICAL DECISION MAKING: Stable/uncomplicated  EVALUATION COMPLEXITY: Low   GOALS: Goals reviewed with patient? Yes  SHORT TERM GOALS: Target date: 02/19/2024  Pt will be independent in initial HEP Baseline: Goal status: INITIAL  2.  Pt will improve DF range on Rt ankle to >= 5 degrees Baseline:  Goal status: INITIAL   LONG TERM GOALS: Target date: 03/18/2024  Pt will be independent with advanced HEP Baseline:  Goal status: INITIAL  2.  Pt will improve PSFS to <= 1 to demo improved functional mobility Baseline:  Goal status: INITIAL  3.  Pt will demo 4/5 PF  strength on Rt LE Baseline:  Goal status: INITIAL  4.  Pt will tolerate descending a flight of stairs in a reciprocal pattern without railings with pain <= 1/10 Baseline:  Goal status: INITIAL   PLAN:  PT FREQUENCY: 1-2x/week  PT DURATION: 8 weeks  PLANNED INTERVENTIONS: 97164- PT Re-evaluation, 97110-Therapeutic exercises, 97530- Therapeutic activity, [id number]-  Neuromuscular re-education, 97535- Self Care, 40981- Manual therapy, [id number]- Gait training, [phone]- Aquatic Therapy, [phone]- Electrical stimulation (unattended), 97016- Vasopneumatic device, [id number]- Ultrasound, [id number]- Ionotophoresis 4mg /ml Dexamethasone , Patient/Family education, Balance training, Stair training, Taping, Dry Needling, Cryotherapy, and Moist heat  PLAN FOR NEXT SESSION: Progress ankle mobility and strength, stairs.   Flint Hummer, PTA 02/06/2024, 8:43 AM

## 2024-02-06 NOTE — Progress Notes (Unsigned)
 Complete physical exam  Patient: Sandy Rodriguez   DOB: Sep 27, 1976   48 y.o. Female  MRN: [id number]  Subjective:     Chief Complaint  Patient presents with   Annual Exam    Non Fasting    Sandy Rodriguez is a 48 y.o. female who presents today for a complete physical exam. She reports consuming a {diet types:17450} diet. {types:19826} She generally feels {DESC; WELL/FAIRLY WELL/POORLY:18703}. She reports sleeping {DESC; WELL/FAIRLY WELL/POORLY:18703}. She {does/does not:200015} have additional problems to discuss today.   Discuss weight  Failed contrave  and phentermine   Most recent fall risk assessment:    12/11/2022   10:23 AM  Fall Risk   Falls in the past year? 0  Number falls in past yr: 0  Injury with Fall? 0  Risk for fall due to : No Fall Risks  Follow up Falls evaluation completed     Most recent depression screenings:    12/11/2022   10:23 AM 03/08/2020    8:33 AM  PHQ 2/9 Scores  PHQ - 2 Score 0 0  PHQ- 9 Score 1 1    {VISON DENTAL STD PSA (Optional):27386}  {History (Optional):23778}  Patient Care Team: Ranada Vigorito L, PA-C as PCP - General (Family Medicine)   Outpatient Medications Prior to Visit  Medication Sig   CALCIUM PO Take 600 mg by mouth daily.   levonorgestrel  (MIRENA , 52 MG,) 20 MCG/24HR IUD Mirena  20 mcg/24 hours (6 yrs) 52 mg intrauterine device  Take 1 device by intrauterine route.   Multiple Vitamin (MULTIVITAMIN) tablet Take 1 tablet by mouth daily.   [DISCONTINUED] AMBULATORY NON FORMULARY MEDICATION LBS(lower bowel stimulator)   [DISCONTINUED] ondansetron  (ZOFRAN ) 4 MG tablet Take 1 tablet (4 mg total) by mouth every 8 (eight) hours as needed for nausea or vomiting. (Patient not taking: Reported on 02/06/2024)   [DISCONTINUED] phentermine  (ADIPEX-P ) 37.5 MG tablet One tab by mouth qAM   No facility-administered medications prior to visit.    ROS        Objective:     BP (!) 152/78   Pulse 95   Temp 97.6 F (36.4 C) (Oral)    Ht 5\' 6"  (1.676 m)   Wt 230 lb (104.3 kg)   SpO2 99%   BMI 37.12 kg/m  {Vitals History (Optional):23777}  Physical Exam   No results found for any visits on 02/06/24. {Show previous labs (optional):23779}    Assessment & Plan:    Routine Health Maintenance and Physical Exam  Immunization History  Administered Date(s) Administered   Influenza Split 09/12/2011, 08/04/2012   Influenza-Unspecified 08/30/2016   PFIZER(Purple Top)SARS-COV-2 Vaccination 12/29/2019, 01/19/2020   Tdap 10/01/2003, 06/22/2014    Health Maintenance  Topic Date Due   COVID-19 Vaccine (3 - 2024-25 season) 02/22/2024 (Originally 06/01/2023)   HIV Screening  11/20/2026 (Originally 12/06/1992)   MAMMOGRAM  02/19/2024   INFLUENZA VACCINE  04/30/2024   DTaP/Tdap/Td (3 - Td or Tdap) 06/22/2024   Cervical Cancer Screening (HPV/Pap Cotest)  12/10/2025   Colonoscopy  01/28/2026   Hepatitis C Screening  Completed   HPV VACCINES  Aged Out   Meningococcal B Vaccine  Aged Out    Discussed health benefits of physical activity, and encouraged her to engage in regular exercise appropriate for her age and condition.  Problem List Items Addressed This Visit   None Visit Diagnoses       Routine physical examination    -  Primary   Relevant Orders   CBC with Differential/Platelet  CMP14+EGFR   Lipid panel   TSH   VITAMIN D 25 Hydroxy (Vit-D Deficiency, Fractures)      No follow-ups on file.     Corrie Brannen, PA-C

## 2024-02-06 NOTE — Patient Instructions (Signed)
 Zepbound will prescribe to start if start will send next dose for next month and follow up in 3 months.   Health Maintenance, Female Adopting a healthy lifestyle and getting preventive care are important in promoting health and wellness. Ask your health care provider about: The right schedule for you to have regular tests and exams. Things you can do on your own to prevent diseases and keep yourself healthy. What should I know about diet, weight, and exercise? Eat a healthy diet  Eat a diet that includes plenty of vegetables, fruits, low-fat dairy products, and lean protein. Do not eat a lot of foods that are high in solid fats, added sugars, or sodium. Maintain a healthy weight Body mass index (BMI) is used to identify weight problems. It estimates body fat based on height and weight. Your health care provider can help determine your BMI and help you achieve or maintain a healthy weight. Get regular exercise Get regular exercise. This is one of the most important things you can do for your health. Most adults should: Exercise for at least 150 minutes each week. The exercise should increase your heart rate and make you sweat (moderate-intensity exercise). Do strengthening exercises at least twice a week. This is in addition to the moderate-intensity exercise. Spend less time sitting. Even light physical activity can be beneficial. Watch cholesterol and blood lipids Have your blood tested for lipids and cholesterol at 46 years of age, then have this test every 5 years. Have your cholesterol levels checked more often if: Your lipid or cholesterol levels are high. You are older than 46 years of age. You are at high risk for heart disease. What should I know about cancer screening? Depending on your health history and family history, you may need to have cancer screening at various ages. This may include screening for: Breast cancer. Cervical cancer. Colorectal cancer. Skin cancer. Lung  cancer. What should I know about heart disease, diabetes, and high blood pressure? Blood pressure and heart disease High blood pressure causes heart disease and increases the risk of stroke. This is more likely to develop in people who have high blood pressure readings or are overweight. Have your blood pressure checked: Every 3-5 years if you are 48-21 years of age. Every year if you are 68 years old or older. Diabetes Have regular diabetes screenings. This checks your fasting blood sugar level. Have the screening done: Once every three years after age 74 if you are at a normal weight and have a low risk for diabetes. More often and at a younger age if you are overweight or have a high risk for diabetes. What should I know about preventing infection? Hepatitis B If you have a higher risk for hepatitis B, you should be screened for this virus. Talk with your health care provider to find out if you are at risk for hepatitis B infection. Hepatitis C Testing is recommended for: Everyone born from [birth year] through 1963. Anyone with known risk factors for hepatitis C. Sexually transmitted infections (STIs) Get screened for STIs, including gonorrhea and chlamydia, if: You are sexually active and are younger than 46 years of age. You are older than 46 years of age and your health care provider tells you that you are at risk for this type of infection. Your sexual activity has changed since you were last screened, and you are at increased risk for chlamydia or gonorrhea. Ask your health care provider if you are at risk. Ask your health care provider  about whether you are at high risk for HIV. Your health care provider may recommend a prescription medicine to help prevent HIV infection. If you choose to take medicine to prevent HIV, you should first get tested for HIV. You should then be tested every 3 months for as long as you are taking the medicine. Pregnancy If you are about to stop having your  period (premenopausal) and you may become pregnant, seek counseling before you get pregnant. Take 400 to 800 micrograms (mcg) of folic acid every day if you become pregnant. Ask for birth control (contraception) if you want to prevent pregnancy. Osteoporosis and menopause Osteoporosis is a disease in which the bones lose minerals and strength with aging. This can result in bone fractures. If you are 6 years old or older, or if you are at risk for osteoporosis and fractures, ask your health care provider if you should: Be screened for bone loss. Take a calcium or vitamin D supplement to lower your risk of fractures. Be given hormone replacement therapy (HRT) to treat symptoms of menopause. Follow these instructions at home: Alcohol use Do not drink alcohol if: Your health care provider tells you not to drink. You are pregnant, may be pregnant, or are planning to become pregnant. If you drink alcohol: Limit how much you have to: 0-1 drink a day. Know how much alcohol is in your drink. In the [place] one drink equals one 12 oz bottle of beer (355 mL), one 5 oz glass of wine (148 mL), or one 1 oz glass of hard liquor (44 mL). Lifestyle Do not use any products that contain nicotine or tobacco. These products include cigarettes, chewing tobacco, and vaping devices, such as e-cigarettes. If you need help quitting, ask your health care provider. Do not use street drugs. Do not share needles. Ask your health care provider for help if you need support or information about quitting drugs. General instructions Schedule regular health, dental, and eye exams. Stay current with your vaccines. Tell your health care provider if: You often feel depressed. You have ever been abused or do not feel safe at home. Summary Adopting a healthy lifestyle and getting preventive care are important in promoting health and wellness. Follow your health care provider's instructions about healthy diet, exercising, and  getting tested or screened for diseases. Follow your health care provider's instructions on monitoring your cholesterol and blood pressure. This information is not intended to replace advice given to you by your health care provider. Make sure you discuss any questions you have with your health care provider. Document Revised: 02/05/2021 Document Reviewed: 02/05/2021 Elsevier Patient Education  2024 [employer].

## 2024-02-08 ENCOUNTER — Encounter: Payer: Self-pay | Admitting: Physician Assistant

## 2024-02-09 DIAGNOSIS — Z0000 Encounter for general adult medical examination without abnormal findings: Secondary | ICD-10-CM | POA: Diagnosis not present

## 2024-02-10 ENCOUNTER — Encounter: Payer: Self-pay | Admitting: Physician Assistant

## 2024-02-10 LAB — CMP14+EGFR
ALT: 32 IU/L (ref 0–32)
AST: 29 IU/L (ref 0–40)
Albumin: 4.4 g/dL (ref 3.9–4.9)
Alkaline Phosphatase: 51 IU/L (ref 44–121)
BUN/Creatinine Ratio: 16 (ref 9–23)
BUN: 11 mg/dL (ref 6–24)
Bilirubin Total: 0.3 mg/dL (ref 0.0–1.2)
CO2: 25 mmol/L (ref 20–29)
Calcium: 9.6 mg/dL (ref 8.7–10.2)
Chloride: 101 mmol/L (ref 96–106)
Creatinine, Ser: 0.69 mg/dL (ref 0.57–1.00)
Globulin, Total: 2 g/dL (ref 1.5–4.5)
Glucose: 83 mg/dL (ref 70–99)
Potassium: 4.4 mmol/L (ref 3.5–5.2)
Sodium: 139 mmol/L (ref 134–144)
Total Protein: 6.4 g/dL (ref 6.0–8.5)
eGFR: 108 mL/min/{1.73_m2} (ref >59)

## 2024-02-10 LAB — CBC WITH DIFFERENTIAL/PLATELET
Basophils Absolute: 0.1 10*3/uL (ref 0.0–0.2)
Basos: 0 %
EOS (ABSOLUTE): 0.3 10*3/uL (ref 0.0–0.4)
Eos: 2 %
Hematocrit: 43.1 % (ref 34.0–46.6)
Hemoglobin: 14.2 g/dL (ref 11.1–15.9)
Immature Grans (Abs): 0.2 10*3/uL — ABNORMAL HIGH (ref 0.0–0.1)
Immature Granulocytes: 1 %
Lymphocytes Absolute: 3.6 10*3/uL — ABNORMAL HIGH (ref 0.7–3.1)
Lymphs: 26 %
MCH: 30.6 pg (ref 26.6–33.0)
MCHC: 32.9 g/dL (ref 31.5–35.7)
MCV: 93 fL (ref 79–97)
Monocytes Absolute: 1.2 10*3/uL — ABNORMAL HIGH (ref 0.1–0.9)
Monocytes: 9 %
Neutrophils Absolute: 8.5 10*3/uL — ABNORMAL HIGH (ref 1.4–7.0)
Neutrophils: 62 %
Platelets: 333 10*3/uL (ref 150–450)
RBC: 4.64 x10E6/uL (ref 3.77–5.28)
RDW: 12.8 % (ref 11.7–15.4)
WBC: 13.7 10*3/uL — ABNORMAL HIGH (ref 3.4–10.8)

## 2024-02-10 LAB — LIPID PANEL
Chol/HDL Ratio: 3.9 ratio (ref 0.0–4.4)
Cholesterol, Total: 218 mg/dL — ABNORMAL HIGH (ref 100–199)
HDL: 56 mg/dL (ref >39)
LDL Chol Calc (NIH): 143 mg/dL — ABNORMAL HIGH (ref 0–99)
Triglycerides: 108 mg/dL (ref 0–149)
VLDL Cholesterol Cal: 19 mg/dL (ref 5–40)

## 2024-02-10 LAB — VITAMIN D 25 HYDROXY (VIT D DEFICIENCY, FRACTURES): Vit D, 25-Hydroxy: 25.9 ng/mL — ABNORMAL LOW (ref 30.0–100.0)

## 2024-02-10 LAB — TSH: TSH: 1.61 u[IU]/mL (ref 0.450–4.500)

## 2024-02-10 MED ORDER — ZEPBOUND 2.5 MG/0.5ML ~~LOC~~ SOAJ
2.5000 mg | SUBCUTANEOUS | 0 refills | Status: AC
Start: 2024-02-10 — End: ?

## 2024-02-10 MED ORDER — ZEPBOUND 5 MG/0.5ML ~~LOC~~ SOAJ
5.0000 mg | SUBCUTANEOUS | 0 refills | Status: AC
Start: 2024-03-11 — End: ?

## 2024-02-11 ENCOUNTER — Ambulatory Visit: Payer: Self-pay | Admitting: Physician Assistant

## 2024-02-11 ENCOUNTER — Ambulatory Visit

## 2024-02-11 DIAGNOSIS — R293 Abnormal posture: Secondary | ICD-10-CM | POA: Diagnosis not present

## 2024-02-11 DIAGNOSIS — R262 Difficulty in walking, not elsewhere classified: Secondary | ICD-10-CM

## 2024-02-11 DIAGNOSIS — I69918 Other symptoms and signs involving cognitive functions following unspecified cerebrovascular disease: Secondary | ICD-10-CM

## 2024-02-11 DIAGNOSIS — R200 Anesthesia of skin: Secondary | ICD-10-CM

## 2024-02-11 NOTE — Progress Notes (Signed)
 Verlin,   Thyroid  looks great.  Kidney, liver, glucose look good.  Vitamin D low. Make sure taking at least 1000 units D3 daily with dairy for better absorption.  LDL not to goal.  HDL and TG look good.   10 year CV risk looks good. For now I would work on weight loss with diet and exercise changes and recheck cholesterol in 1 year.   Aaron Aas.The 10-year ASCVD risk score (Arnett DK, et al., 2019) is: 0.9%   Values used to calculate the score:     Age: 36 years     Sex: Female     Is Non-Hispanic African American: No     Diabetic: No     Tobacco smoker: No     Systolic Blood Pressure: 120 mmHg     Is BP treated: No     HDL Cholesterol: 56 mg/dL     Total Cholesterol: 218 mg/dL  WBC stable.

## 2024-02-11 NOTE — Therapy (Signed)
 OUTPATIENT PHYSICAL THERAPY LOWER EXTREMITY TREATMENT   Patient Name: Sandy Rodriguez MRN: [id number] DOB:July 16, 1976, 48 y.o., female Today's Date: 02/11/2024  END OF SESSION:  PT End of Session - 02/11/24 0847     Visit Number 4    Number of Visits 16    Date for PT Re-Evaluation 03/18/24    Authorization Type BCBS    Authorization - Visit Number 4    Authorization - Number of Visits 30    PT Start Time 0848    PT Stop Time 0932    PT Time Calculation (min) 44 min    Activity Tolerance Patient tolerated treatment well    Behavior During Therapy [facility] for tasks assessed/performed            History reviewed. No pertinent past medical history. Past Surgical History:  Procedure Laterality Date   BREAST REDUCTION SURGERY  09/30/1996   CHOLECYSTECTOMY     REDUCTION MAMMAPLASTY     Patient Active Problem List   Diagnosis Date Noted   Class 2 obesity due to excess calories without serious comorbidity with body mass index (BMI) of 37.0 to 37.9 in adult 02/06/2024   Left lumbar radiculitis 12/20/2022   Acute cough 09/27/2022   Viral upper respiratory tract infection 09/27/2022   Pilar cyst 03/08/2020   Callus of heel 04/13/2019   Sinus tarsitis, right 12/04/2018   Retrocalcaneal bursitis, right 12/12/2017   Pain at the base of the right fifth metatarsal 12/12/2017   Ankle pain, left 12/12/2017   Gastrocnemius muscle strain, right, initial encounter 11/06/2017   Onychomycosis 11/20/2016   Allergic sinusitis 06/14/2015   Class 1 obesity due to excess calories without serious comorbidity with body mass index (BMI) of 33.0 to 33.9 in adult 06/14/2015   Abnormal weight gain 06/22/2014   Constipation 06/22/2014   SKIN TAG 04/09/2010    PCP: Lindaann Requena  REFERRING PROVIDER: Alvah Auerbach  REFERRING DIAG: Rt achilles tendonitis  THERAPY DIAG:  Difficulty in walking, not elsewhere classified  Numbness of left foot  Other symptoms and signs involving cognitive functions following  unspecified cerebrovascular disease  Abnormal posture  Rationale for Evaluation and Treatment: Rehabilitation  ONSET DATE: 09/30/23  SUBJECTIVE:   SUBJECTIVE STATEMENT: Patient reports she is able to walk down stairs better with less pain when wearing shoes; states she was standing at her daughter's game for a few hours with minimal pain in heel/foot; states she was able to squat down briefly while doing yard work. Patient leaves for trip out west June 11th and will be hiking on rocky terrain.   Pt is s/p right haglund's resection and secondary achilles tendon repair with right plantar fasciotomy on 09/30/23. She states she was initially NWB then progressed to wt bearing with crutches and wore a boot until March. She states that she still has difficulty going down stairs and that her gait doesn't feel "quite right". She does have some Rt ankle soreness with prolonged activity and she feels like her Rt ankle is "stiff".  PERTINENT HISTORY: Plantar fasciitis PAIN:  Are you having pain? Yes: NPRS scale: 0/10 currently, 2/10 at worst Pain location: Rt ankle/foot Pain description: stiff Aggravating factors: prolonged activity Relieving factors: compression sock, rest  PRECAUTIONS: None  RED FLAGS: None   WEIGHT BEARING RESTRICTIONS: No  FALLS:  Has patient fallen in last 6 months? No   OCCUPATION: works for [employer] at home  PLOF: Independent  PATIENT GOALS: be able to go down stairs without railing, walk faster  NEXT MD  VISIT: 02/27/24  OBJECTIVE:  Note: Objective measures were completed at Evaluation unless otherwise noted.   PATIENT SURVEYS:  Patient specific functional scale: descending stairs = 5   PALPATION: Hypomobile TC and interphalangeal mobs Rt Mild TTP around incision on achilles Mild edema noted  LOWER EXTREMITY ROM:  Active ROM Right eval Left eval  Hip flexion    Hip extension    Hip abduction    Hip adduction    Hip internal rotation    Hip  external rotation    Knee flexion    Knee extension    Ankle dorsiflexion 2 5  Ankle plantarflexion 55 71  Ankle inversion 18 40  Ankle eversion 11 30   (Blank rows = not tested)  LOWER EXTREMITY MMT:  MMT Right eval Left eval  Hip flexion    Hip extension    Hip abduction    Hip adduction    Hip internal rotation    Hip external rotation    Knee flexion    Knee extension    Ankle dorsiflexion 4+ 5  Ankle plantarflexion 3- 5  Ankle inversion 4+ 5  Ankle eversion 4 5   (Blank rows = not tested)    FUNCTIONAL TESTS:  SLS > 15 sec bilat Pt unable to perform SL heel raise on Rt. Unable to perform eccentric heel lowering due to pain  TUG: 8 seconds  GAIT:  Comments: decreased Rt DF during gait Pain with descending stairs leading with Rt foot - requires railing to descend stairs   [facility] Adult PT Treatment:                                                DATE: 02/11/2024 Therapeutic Exercise: Seated: Alt towel scrunch & toe splay Alt heel/toe raises Standing towel scrunch --> gradually bringing towel in Seated ankle EV/INV AROM Resisted ankle PF/DF + RTB 2x10 each Standing gastroc & soleus stretches 5x20" each Neuromuscular re-ed: Barefoot: Staggered stance weight shifting with focus on bending great toe on Rt Staggered stance fwd weight shift on R LE + dynamic arch lift x10 Therapeutic Activity: Lateral weight shifting --> standing on airex  Stepping on/off airex --> leading with Rt LE Runner's step up --> Rt foot on 6" step + fingertip support PRN Side Lying: Hydrant + GTB x15 Clamshells + GTB x15 Hip abduction & extension on diagonal + GTB x15   [facility] Adult PT Treatment:                                                DATE: 02/06/2024 Therapeutic Exercise: Seated:  Rocker board --> ankle DF/PF, EV/INV Alt heel/toe raises Towel scrunch Toe yoga Marble pick up --> individual toe Long sitting gastroc & soleus stretches with towel 3x30" each Plantar fascia  stretch 5x10" Standing --> fascial massage plantar surface Rt foot with tennis ball Neuromuscular re-ed: Barefoot: Standing foot doming  Staggered stance fwd weight shift on R LE + dynamic arch lift x10 Heel raises + tennis ball b/w ankles x 5 Eccentric heel raises x10 Step down 6" step --> stepping down with Lt, bilateral railing support   [facility] Adult PT Treatment:  DATE: 02/03/2024 Therapeutic Exercise: Seated, foot on towel: Alt towel scrunch + toe splay Toe yoga progression Ankle EV/INV AROM  Great toe extension stretch 5x10" Long sitting gastroc & soleus stretches with towel 5x10" Standing gastroc stretch Side Lying: Bent knee hip abd + GTB x10 Clamshells + GTB x10 Hip abduction & extension on diagonal + GTB x10 Neuromuscular re-ed: BAPS board --> ankle DF/PF, EV/INV, circles CW/CCW Standing foot doming 10x5" Heel raises + tennis ball b/w ankles Rt single leg balance x 30" 6" runner's step up --> fingertip support PRN Therapeutic Activity: TUG (8 sec) Staggered stance weight shifting --> focus on Rt great toe extension tolerance Modalities: Ice massage at home as needed base of achilles                                                                                                                               PATIENT EDUCATION:  Education details: Updated HEP Person educated: Patient Education method: Explanation, Demonstration, and Handouts Education comprehension: verbalized understanding and returned demonstration  HOME EXERCISE PROGRAM: Access Code: [email or web address] URL: [email or web address] Date: 02/11/2024 Prepared by: Sims Duck  Exercises - Gastroc Stretch on Wall  - 3-5 x daily - 7 x weekly - 1 sets - 3 reps - 20-30 seconds hold - Soleus Stretch on Wall  - 3-5 x daily - 7 x weekly - 1 sets - 3 reps - 20-30 seconds hold - Standing Heel Raise with Support  - 1 x daily - 7 x weekly - 2 sets - 10  reps - Toe Yoga - Alternating Great Toe and Lesser Toe Extension  - 1 x daily - 7 x weekly - 1-3 sets - 10 reps - Seated Self Great Toe Stretch  - 1 x daily - 7 x weekly - 3 sets - 10 reps - Towel Scrunches  - 1 x daily - 7 x weekly - 3 sets - 10 reps - Stride Stance Weight Shift  - 1 x daily - 7 x weekly - 3 sets - 10 reps - Runner's Step Up/Down  - 1 x daily - 7 x weekly - 3 sets - 10 reps - Sidelying Bent Knee Lift at 45 Degrees  - 1 x daily - 7 x weekly - 3 sets - 10 reps - Clam with Resistance  - 1 x daily - 7 x weekly - 3 sets - 10 reps - Sidelying Hip Abduction and Extension with Loop Band  - 1 x daily - 7 x weekly - 3 sets - 10 reps - Ankle Dorsiflexion with Resistance  - 1 x daily - 7 x weekly - 3 sets - 10 reps - Ankle and Toe Plantarflexion with Resistance  - 1 x daily - 7 x weekly - 3 sets - 10 reps  ASSESSMENT:  CLINICAL IMPRESSION: Resisted ankle exercises added to HEP; continued eccentric heel raise progression with weight bearing on Rt LE. Single leg balance exercises progressed to tolerance, challenging  postural stability and weight bearing tolerance on Rt LE. Weight shifting on airex added to challenge ankle stability in preparation for upcoming hiking trip.   EVAL: Patient is a 48 y.o. female who was seen today for physical therapy evaluation and treatment for Rt achilles tendonitis s/p right haglund's resection and secondary achilles tendon repair with right plantar fasciotomy. She presents with decreased ROM and strength, impaired gait and decreased functional mobility. Pt will benefit from skilled PT to address deficits and improve functional abilities in gait and stair negotiation.   OBJECTIVE IMPAIRMENTS: decreased activity tolerance, difficulty walking, decreased ROM, decreased strength, impaired flexibility, and pain.   ACTIVITY LIMITATIONS: stairs and locomotion level  PARTICIPATION LIMITATIONS: yard work  PERSONAL FACTORS: Time since onset of  injury/illness/exacerbation are also affecting patient's functional outcome.   REHAB POTENTIAL: Good  CLINICAL DECISION MAKING: Stable/uncomplicated  EVALUATION COMPLEXITY: Low   GOALS: Goals reviewed with patient? Yes  SHORT TERM GOALS: Target date: 02/19/2024  Pt will be independent in initial HEP Baseline: Goal status: INITIAL  2.  Pt will improve DF range on Rt ankle to >= 5 degrees Baseline:  Goal status: INITIAL   LONG TERM GOALS: Target date: 03/18/2024  Pt will be independent with advanced HEP Baseline:  Goal status: INITIAL  2.  Pt will improve PSFS to <= 1 to demo improved functional mobility Baseline:  Goal status: INITIAL  3.  Pt will demo 4/5 PF strength on Rt LE Baseline:  Goal status: INITIAL  4.  Pt will tolerate descending a flight of stairs in a reciprocal pattern without railings with pain <= 1/10 Baseline:  Goal status: INITIAL   PLAN:  PT FREQUENCY: 1-2x/week  PT DURATION: 8 weeks  PLANNED INTERVENTIONS: 97164- PT Re-evaluation, 97110-Therapeutic exercises, 97530- Therapeutic activity, [id number]- Neuromuscular re-education, 97535- Self Care, 86578- Manual therapy, [id number]- Gait training, [phone]- Aquatic Therapy, [phone]- Electrical stimulation (unattended), 97016- Vasopneumatic device, [id number]- Ultrasound, 13244- Ionotophoresis 4mg /ml Dexamethasone , Patient/Family education, Balance training, Stair training, Taping, Dry Needling, Cryotherapy, and Moist heat  PLAN FOR NEXT SESSION: Check STGs next visit. Progress ankle mobility and strength, stairs. Uneven surfaces in preparation for hiking trip.   Flint Hummer, PTA 02/11/2024, 9:34 AM

## 2024-02-18 ENCOUNTER — Ambulatory Visit

## 2024-02-18 DIAGNOSIS — R200 Anesthesia of skin: Secondary | ICD-10-CM | POA: Diagnosis not present

## 2024-02-18 DIAGNOSIS — R293 Abnormal posture: Secondary | ICD-10-CM | POA: Diagnosis not present

## 2024-02-18 DIAGNOSIS — R262 Difficulty in walking, not elsewhere classified: Secondary | ICD-10-CM | POA: Diagnosis not present

## 2024-02-18 DIAGNOSIS — I69918 Other symptoms and signs involving cognitive functions following unspecified cerebrovascular disease: Secondary | ICD-10-CM | POA: Diagnosis not present

## 2024-02-18 NOTE — Therapy (Signed)
 OUTPATIENT PHYSICAL THERAPY LOWER EXTREMITY TREATMENT   Patient Name: Sandy Rodriguez MRN: [id number] DOB:04-09-1976, 48 y.o., female Today's Date: 02/18/2024  END OF SESSION:  PT End of Session - 02/18/24 0806     Visit Number 5    Number of Visits 16    Date for PT Re-Evaluation 03/18/24    Authorization Type BCBS    Authorization - Visit Number 5    Authorization - Number of Visits 30    PT Start Time 0805    PT Stop Time 0845    PT Time Calculation (min) 40 min    Activity Tolerance Patient tolerated treatment well    Behavior During Therapy [facility] for tasks assessed/performed            History reviewed. No pertinent past medical history. Past Surgical History:  Procedure Laterality Date   BREAST REDUCTION SURGERY  09/30/1996   CHOLECYSTECTOMY     REDUCTION MAMMAPLASTY     Patient Active Problem List   Diagnosis Date Noted   Class 2 obesity due to excess calories without serious comorbidity with body mass index (BMI) of 37.0 to 37.9 in adult 02/06/2024   Left lumbar radiculitis 12/20/2022   Acute cough 09/27/2022   Viral upper respiratory tract infection 09/27/2022   Pilar cyst 03/08/2020   Callus of heel 04/13/2019   Sinus tarsitis, right 12/04/2018   Retrocalcaneal bursitis, right 12/12/2017   Pain at the base of the right fifth metatarsal 12/12/2017   Ankle pain, left 12/12/2017   Gastrocnemius muscle strain, right, initial encounter 11/06/2017   Onychomycosis 11/20/2016   Allergic sinusitis 06/14/2015   Class 1 obesity due to excess calories without serious comorbidity with body mass index (BMI) of 33.0 to 33.9 in adult 06/14/2015   Abnormal weight gain 06/22/2014   Constipation 06/22/2014   SKIN TAG 04/09/2010    PCP: Lindaann Requena  REFERRING PROVIDER: Alvah Auerbach  REFERRING DIAG: Rt achilles tendonitis  THERAPY DIAG:  Difficulty in walking, not elsewhere classified  Numbness of left foot  Other symptoms and signs involving cognitive functions following  unspecified cerebrovascular disease  Abnormal posture  Rationale for Evaluation and Treatment: Rehabilitation  ONSET DATE: 09/30/23  SUBJECTIVE:   SUBJECTIVE STATEMENT: Patient reports she was able to walk barefoot on beach over the weekend with minimal soreness afterwards; states more than anything her achilles feels tight.   Pt is s/p right haglund's resection and secondary achilles tendon repair with right plantar fasciotomy on 09/30/23. She states she was initially NWB then progressed to wt bearing with crutches and wore a boot until March. She states that she still has difficulty going down stairs and that her gait doesn't feel "quite right". She does have some Rt ankle soreness with prolonged activity and she feels like her Rt ankle is "stiff".  PERTINENT HISTORY: Plantar fasciitis PAIN:  Are you having pain? Yes: NPRS scale: 0/10 currently, 2/10 at worst Pain location: Rt ankle/foot Pain description: stiff Aggravating factors: prolonged activity Relieving factors: compression sock, rest  PRECAUTIONS: None  RED FLAGS: None   WEIGHT BEARING RESTRICTIONS: No  FALLS:  Has patient fallen in last 6 months? No   OCCUPATION: works for [employer] at home  PLOF: Independent  PATIENT GOALS: be able to go down stairs without railing, walk faster  NEXT MD VISIT: 02/27/24  OBJECTIVE:  Note: Objective measures were completed at Evaluation unless otherwise noted.   PATIENT SURVEYS:  Patient specific functional scale: descending stairs = 5   PALPATION: Hypomobile TC and interphalangeal  mobs Rt Mild TTP around incision on achilles Mild edema noted  LOWER EXTREMITY ROM:  Active ROM Right eval Left eval Right 02/18/24  Hip flexion     Hip extension     Hip abduction     Hip adduction     Hip internal rotation     Hip external rotation     Knee flexion     Knee extension     Ankle dorsiflexion 2 5 5   Ankle plantarflexion 55 71   Ankle inversion 18 40   Ankle  eversion 11 30    (Blank rows = not tested)  LOWER EXTREMITY MMT:  MMT Right eval Left eval  Hip flexion    Hip extension    Hip abduction    Hip adduction    Hip internal rotation    Hip external rotation    Knee flexion    Knee extension    Ankle dorsiflexion 4+ 5  Ankle plantarflexion 3- 5  Ankle inversion 4+ 5  Ankle eversion 4 5   (Blank rows = not tested)    FUNCTIONAL TESTS:  SLS > 15 sec bilat Pt unable to perform SL heel raise on Rt. Unable to perform eccentric heel lowering due to pain  TUG: 8 seconds  GAIT:  Comments: decreased Rt DF during gait Pain with descending stairs leading with Rt foot - requires railing to descend stairs   [facility] Adult PT Treatment:                                                DATE: 02/18/2024 Therapeutic Exercise: STG update Seated: Alt towel scrunch & toe splay Toe yoga Ankle EV/INV AROM Resisted ankle PF/DF + GTB 2x15 each Standing gastroc & soleus stretches  Manual Therapy: IASTM gastroc/soleus Scar tissue massage along incision Neuromuscular re-ed: Eccentric heel raises --> gradually increasing load on R LE Rt SLB solid ground x30" --> airex 2x30" Foam beam: Side stepping Tandem walking fwd/bkwd   [facility] Adult PT Treatment:                                                DATE: 02/11/2024 Therapeutic Exercise: Seated: Alt towel scrunch & toe splay Alt heel/toe raises Standing towel scrunch --> gradually bringing towel in Seated ankle EV/INV AROM Resisted ankle PF/DF + RTB 2x10 each Standing gastroc & soleus stretches 5x20" each Neuromuscular re-ed: Barefoot: Staggered stance weight shifting with focus on bending great toe on Rt Staggered stance fwd weight shift on R LE + dynamic arch lift x10 Therapeutic Activity: Lateral weight shifting --> standing on airex  Stepping on/off airex --> leading with Rt LE Runner's step up --> Rt foot on 6" step + fingertip support PRN Side Lying: Hydrant + GTB x15 Clamshells  + GTB x15 Hip abduction & extension on diagonal + GTB x15   [facility] Adult PT Treatment:                                                DATE: 02/06/2024 Therapeutic Exercise: Seated:  Rocker board --> ankle DF/PF, EV/INV Alt heel/toe  raises Towel scrunch Toe yoga Marble pick up --> individual toe Long sitting gastroc & soleus stretches with towel 3x30" each Plantar fascia stretch 5x10" Standing --> fascial massage plantar surface Rt foot with tennis ball Neuromuscular re-ed: Barefoot: Standing foot doming  Staggered stance fwd weight shift on R LE + dynamic arch lift x10 Heel raises + tennis ball b/w ankles x 5 Eccentric heel raises x10 Step down 6" step --> stepping down with Lt, bilateral railing support                                                                                                                               PATIENT EDUCATION:  Education details: Updated HEP Person educated: Patient Education method: Explanation, Demonstration, and Handouts Education comprehension: verbalized understanding and returned demonstration  HOME EXERCISE PROGRAM: Access Code: [email or web address] URL: [email or web address] Date: 02/11/2024 Prepared by: Sims Duck  Exercises - Gastroc Stretch on Wall  - 3-5 x daily - 7 x weekly - 1 sets - 3 reps - 20-30 seconds hold - Soleus Stretch on Wall  - 3-5 x daily - 7 x weekly - 1 sets - 3 reps - 20-30 seconds hold - Standing Heel Raise with Support  - 1 x daily - 7 x weekly - 2 sets - 10 reps - Toe Yoga - Alternating Great Toe and Lesser Toe Extension  - 1 x daily - 7 x weekly - 1-3 sets - 10 reps - Seated Self Great Toe Stretch  - 1 x daily - 7 x weekly - 3 sets - 10 reps - Towel Scrunches  - 1 x daily - 7 x weekly - 3 sets - 10 reps - Stride Stance Weight Shift  - 1 x daily - 7 x weekly - 3 sets - 10 reps - Runner's Step Up/Down  - 1 x daily - 7 x weekly - 3 sets - 10 reps - Sidelying Bent Knee Lift at 45 Degrees  - 1 x daily - 7 x  weekly - 3 sets - 10 reps - Clam with Resistance  - 1 x daily - 7 x weekly - 3 sets - 10 reps - Sidelying Hip Abduction and Extension with Loop Band  - 1 x daily - 7 x weekly - 3 sets - 10 reps - Ankle Dorsiflexion with Resistance  - 1 x daily - 7 x weekly - 3 sets - 10 reps - Ankle and Toe Plantarflexion with Resistance  - 1 x daily - 7 x weekly - 3 sets - 10 reps  Access Code: [email or web address] URL: [email or web address] Date: 02/18/2024 Prepared by: Sims Duck  Patient Education - Scar Massage  ASSESSMENT:  CLINICAL IMPRESSION:  Scar massage performed on incision; handout provided to patient to add with HEP. Patient tolerated gradual increase to full weight bearing on Rt LE during eccentric heel raises. Multi-directional stepping on compliant surface incorporated in preparation for upcoming hiking trip.   EVAL: Patient is a 48  y.o. female who was seen today for physical therapy evaluation and treatment for Rt achilles tendonitis s/p right haglund's resection and secondary achilles tendon repair with right plantar fasciotomy. She presents with decreased ROM and strength, impaired gait and decreased functional mobility. Pt will benefit from skilled PT to address deficits and improve functional abilities in gait and stair negotiation.   OBJECTIVE IMPAIRMENTS: decreased activity tolerance, difficulty walking, decreased ROM, decreased strength, impaired flexibility, and pain.   ACTIVITY LIMITATIONS: stairs and locomotion level  PARTICIPATION LIMITATIONS: yard work  PERSONAL FACTORS: Time since onset of injury/illness/exacerbation are also affecting patient's functional outcome.   REHAB POTENTIAL: Good  CLINICAL DECISION MAKING: Stable/uncomplicated  EVALUATION COMPLEXITY: Low   GOALS: Goals reviewed with patient? Yes  SHORT TERM GOALS: Target date: 02/19/2024  Pt will be independent in initial HEP Baseline: Goal status: MET  2.  Pt will improve DF range on Rt ankle  to >= 5 degrees Baseline:  02/18/24: 5 degrees Goal status: MET   LONG TERM GOALS: Target date: 03/18/2024  Pt will be independent with advanced HEP Baseline:  Goal status: INITIAL  2.  Pt will improve PSFS to <= 1 to demo improved functional mobility Baseline:  Goal status: INITIAL  3.  Pt will demo 4/5 PF strength on Rt LE Baseline:  Goal status: INITIAL  4.  Pt will tolerate descending a flight of stairs in a reciprocal pattern without railings with pain <= 1/10 Baseline:  Goal status: INITIAL   PLAN:  PT FREQUENCY: 1-2x/week  PT DURATION: 8 weeks  PLANNED INTERVENTIONS: 97164- PT Re-evaluation, 97110-Therapeutic exercises, 97530- Therapeutic activity, [id number]- Neuromuscular re-education, 97535- Self Care, [id number]- Manual therapy, [id number]- Gait training, [phone]- Aquatic Therapy, [phone]- Electrical stimulation (unattended), 97016- Vasopneumatic device, [id number]- Ultrasound, [id number]- Ionotophoresis 4mg /ml Dexamethasone , Patient/Family education, Balance training, Stair training, Taping, Dry Needling, Cryotherapy, and Moist heat  PLAN FOR NEXT SESSION: Progress ankle mobility and strength, stairs. Uneven surfaces in preparation for hiking trip.   Flint Hummer, PTA 02/18/2024, 8:45 AM

## 2024-02-27 ENCOUNTER — Encounter: Payer: Self-pay | Admitting: Podiatry

## 2024-02-27 ENCOUNTER — Ambulatory Visit (INDEPENDENT_AMBULATORY_CARE_PROVIDER_SITE_OTHER): Admitting: Podiatry

## 2024-02-27 ENCOUNTER — Ambulatory Visit: Admitting: Rehabilitative and Restorative Service Providers"

## 2024-02-27 DIAGNOSIS — Z9889 Other specified postprocedural states: Secondary | ICD-10-CM

## 2024-02-27 DIAGNOSIS — M7661 Achilles tendinitis, right leg: Secondary | ICD-10-CM

## 2024-02-27 NOTE — Progress Notes (Signed)
  Subjective:  Patient ID: Sandy Rodriguez, female    DOB: [birth year],  MRN: [id number]  Chief Complaint  Patient presents with   Routine Post Op    DOS 09/30/2023 Procedure: Right haglund's resection and secondary achilles tendon repair with right plantar fasciotomy  "It's good.  There's not any swelling anymore."     DOS: 09/30/23 Procedure: Right haglund's resection and secondary achilles tendon repair with right plantar fasciotomy   48 y.o. female returns for POV#5. Relates doing well and managing pain. Has been back to normal activity and been in PT which has helped. Swelling has improved.   Review of Systems: Negative except as noted in the HPI. Denies N/V/F/Ch.  History reviewed. No pertinent past medical history.  Current Outpatient Medications:    CALCIUM PO, Take 600 mg by mouth daily., Disp: , Rfl:    levonorgestrel  (MIRENA , 52 MG,) 20 MCG/24HR IUD, Mirena  20 mcg/24 hours (6 yrs) 52 mg intrauterine device  Take 1 device by intrauterine route., Disp: , Rfl:    Multiple Vitamin (MULTIVITAMIN) tablet, Take 1 tablet by mouth daily., Disp: , Rfl:    tirzepatide  (ZEPBOUND ) 2.5 MG/0.5ML Pen, Inject 2.5 mg into the skin once a week., Disp: 2 mL, Rfl: 0   [START ON 03/11/2024] tirzepatide  (ZEPBOUND ) 5 MG/0.5ML Pen, Inject 5 mg into the skin once a week., Disp: 2 mL, Rfl: 0  Social History   Tobacco Use  Smoking Status Never  Smokeless Tobacco Never    Allergies  Allergen Reactions   Belviq [Lorcaserin  Hcl]     Makes her feel weird.    Sulfonamide Derivatives    Objective:  There were no vitals filed for this visit. There is no height or weight on file to calculate BMI. Constitutional Well developed. Well nourished.  Vascular Foot warm and well perfused. Capillary refill normal to all digits.   Neurologic Normal speech. Oriented to person, place, and time. Epicritic sensation to light touch grossly present bilaterally.  Dermatologic Skin healing well without signs of  infection. Skin edges well coapted without signs of infection.  Orthopedic: Tenderness to palpation noted about the surgical site.   Radiographs: Interval resection of haglund deformity  Assessment:   1. Post-operative state   2. Tendonitis, Achilles, right       Plan:  Patient was evaluated and treated and all questions answered.  S/p foot surgery right -Progressing as expected post-operatively. -WB Status: WBAT in regular shoe.  -Medications: n/a  Patient discharged from surgical standpoint.   Return if symptoms worsen or fail to improve.

## 2024-03-03 ENCOUNTER — Encounter: Payer: Self-pay | Admitting: Physical Therapy

## 2024-03-03 ENCOUNTER — Ambulatory Visit: Attending: Podiatry | Admitting: Physical Therapy

## 2024-03-03 DIAGNOSIS — R200 Anesthesia of skin: Secondary | ICD-10-CM | POA: Diagnosis not present

## 2024-03-03 DIAGNOSIS — R262 Difficulty in walking, not elsewhere classified: Secondary | ICD-10-CM | POA: Insufficient documentation

## 2024-03-03 DIAGNOSIS — I69918 Other symptoms and signs involving cognitive functions following unspecified cerebrovascular disease: Secondary | ICD-10-CM | POA: Diagnosis not present

## 2024-03-03 NOTE — Therapy (Signed)
 OUTPATIENT PHYSICAL THERAPY LOWER EXTREMITY TREATMENT   Patient Name: CORINA STACY MRN: [id number] DOB:[birth year], 48 y.o., female Today's Date: 03/03/2024  END OF SESSION:  PT End of Session - 03/03/24 0924     Visit Number 6    Number of Visits 16    Date for PT Re-Evaluation 03/18/24    Authorization - Visit Number 6    Authorization - Number of Visits 30    PT Start Time 0845    PT Stop Time 0924    PT Time Calculation (min) 39 min    Activity Tolerance Patient tolerated treatment well    Behavior During Therapy [facility] for tasks assessed/performed             History reviewed. No pertinent past medical history. Past Surgical History:  Procedure Laterality Date   BREAST REDUCTION SURGERY  09/30/1996   CHOLECYSTECTOMY     REDUCTION MAMMAPLASTY     Patient Active Problem List   Diagnosis Date Noted   Class 2 obesity due to excess calories without serious comorbidity with body mass index (BMI) of 37.0 to 37.9 in adult 02/06/2024   Left lumbar radiculitis 12/20/2022   Acute cough 09/27/2022   Viral upper respiratory tract infection 09/27/2022   Pilar cyst 03/08/2020   Callus of heel 04/13/2019   Sinus tarsitis, right 12/04/2018   Retrocalcaneal bursitis, right 12/12/2017   Pain at the base of the right fifth metatarsal 12/12/2017   Ankle pain, left 12/12/2017   Gastrocnemius muscle strain, right, initial encounter 11/06/2017   Onychomycosis 11/20/2016   Allergic sinusitis 06/14/2015   Class 1 obesity due to excess calories without serious comorbidity with body mass index (BMI) of 33.0 to 33.9 in adult 06/14/2015   Abnormal weight gain 06/22/2014   Constipation 06/22/2014   SKIN TAG 04/09/2010    PCP: Lindaann Requena  REFERRING PROVIDER: Alvah Auerbach  REFERRING DIAG: Rt achilles tendonitis  THERAPY DIAG:  Difficulty in walking, not elsewhere classified  Rationale for Evaluation and Treatment: Rehabilitation  ONSET DATE: 09/30/23  SUBJECTIVE:   SUBJECTIVE  STATEMENT: Patient reports she was able to walk around [place] all day with minimal pain. She leaves for her hiking trip next week  Pt is s/p right haglund's resection and secondary achilles tendon repair with right plantar fasciotomy on 09/30/23. She states she was initially NWB then progressed to wt bearing with crutches and wore a boot until March. She states that she still has difficulty going down stairs and that her gait doesn't feel "quite right". She does have some Rt ankle soreness with prolonged activity and she feels like her Rt ankle is "stiff".  PERTINENT HISTORY: Plantar fasciitis PAIN:  Are you having pain? Yes: NPRS scale: 0/10 currently, 2/10 at worst Pain location: Rt ankle/foot Pain description: stiff Aggravating factors: prolonged activity Relieving factors: compression sock, rest  PRECAUTIONS: None  RED FLAGS: None   WEIGHT BEARING RESTRICTIONS: No  FALLS:  Has patient fallen in last 6 months? No   OCCUPATION: works for [employer] at home  PLOF: Independent  PATIENT GOALS: be able to go down stairs without railing, walk faster  NEXT MD VISIT: 02/27/24  OBJECTIVE:  Note: Objective measures were completed at Evaluation unless otherwise noted.   PATIENT SURVEYS:  Patient specific functional scale: descending stairs = 5   PALPATION: Hypomobile TC and interphalangeal mobs Rt Mild TTP around incision on achilles Mild edema noted  LOWER EXTREMITY ROM:  Active ROM Right eval Left eval Right 02/18/24  Hip flexion  Hip extension     Hip abduction     Hip adduction     Hip internal rotation     Hip external rotation     Knee flexion     Knee extension     Ankle dorsiflexion 2 5 5   Ankle plantarflexion 55 71   Ankle inversion 18 40   Ankle eversion 11 30    (Blank rows = not tested)  LOWER EXTREMITY MMT:  MMT Right eval Left eval  Hip flexion    Hip extension    Hip abduction    Hip adduction    Hip internal rotation    Hip external  rotation    Knee flexion    Knee extension    Ankle dorsiflexion 4+ 5  Ankle plantarflexion 3- 5  Ankle inversion 4+ 5  Ankle eversion 4 5   (Blank rows = not tested)    FUNCTIONAL TESTS:  SLS > 15 sec bilat Pt unable to perform SL heel raise on Rt. Unable to perform eccentric heel lowering due to pain  TUG: 8 seconds  GAIT:  Comments: decreased Rt DF during gait Pain with descending stairs leading with Rt foot - requires railing to descend stairs   [facility] Adult PT Treatment:                                                DATE: 03/03/24 Therapeutic Exercise/Activity/NMR: Seated Alt towel scrunch and toe splay Toe yoga Ankle inv/ev AROM Ankle inv/ev green TB Standing heel raise with Rt wt shift --> eccentric lowering off 4'' step SL heel raise with UE support x 10 Gastroc and soleus stretching SL balance airex x 30 seconds SL on airex with 10#KB circles CW/CCW Lateral step up to airex foam on 6'' step with opp hip abd Runners step up onto airex foam on 6'' step Tandem walking forward/bkwd on airex beam Sidestep on airex beam SL pallof 5# x 10 bilat   [facility] Adult PT Treatment:                                                DATE: 02/18/2024 Therapeutic Exercise: STG update Seated: Alt towel scrunch & toe splay Toe yoga Ankle EV/INV AROM Resisted ankle PF/DF + GTB 2x15 each Standing gastroc & soleus stretches  Manual Therapy: IASTM gastroc/soleus Scar tissue massage along incision Neuromuscular re-ed: Eccentric heel raises --> gradually increasing load on R LE Rt SLB solid ground x30" --> airex 2x30" Foam beam: Side stepping Tandem walking fwd/bkwd   [facility] Adult PT Treatment:                                                DATE: 02/11/2024 Therapeutic Exercise: Seated: Alt towel scrunch & toe splay Alt heel/toe raises Standing towel scrunch --> gradually bringing towel in Seated ankle EV/INV AROM Resisted ankle PF/DF + RTB 2x10 each Standing gastroc & soleus  stretches 5x20" each Neuromuscular re-ed: Barefoot: Staggered stance weight shifting with focus on bending great toe on Rt Staggered stance fwd weight shift on R LE + dynamic arch lift  x10 Therapeutic Activity: Lateral weight shifting --> standing on airex  Stepping on/off airex --> leading with Rt LE Runner's step up --> Rt foot on 6" step + fingertip support PRN Side Lying: Hydrant + GTB x15 Clamshells + GTB x15 Hip abduction & extension on diagonal + GTB x15                                                                                                                               PATIENT EDUCATION:  Education details: Updated HEP Person educated: Patient Education method: Explanation, Demonstration, and Handouts Education comprehension: verbalized understanding and returned demonstration  HOME EXERCISE PROGRAM: Access Code: [email or web address] URL: [email or web address] Date: 02/11/2024 Prepared by: Sims Duck  Exercises - Gastroc Stretch on Wall  - 3-5 x daily - 7 x weekly - 1 sets - 3 reps - 20-30 seconds hold - Soleus Stretch on Wall  - 3-5 x daily - 7 x weekly - 1 sets - 3 reps - 20-30 seconds hold - Standing Heel Raise with Support  - 1 x daily - 7 x weekly - 2 sets - 10 reps - Toe Yoga - Alternating Great Toe and Lesser Toe Extension  - 1 x daily - 7 x weekly - 1-3 sets - 10 reps - Seated Self Great Toe Stretch  - 1 x daily - 7 x weekly - 3 sets - 10 reps - Towel Scrunches  - 1 x daily - 7 x weekly - 3 sets - 10 reps - Stride Stance Weight Shift  - 1 x daily - 7 x weekly - 3 sets - 10 reps - Runner's Step Up/Down  - 1 x daily - 7 x weekly - 3 sets - 10 reps - Sidelying Bent Knee Lift at 45 Degrees  - 1 x daily - 7 x weekly - 3 sets - 10 reps - Clam with Resistance  - 1 x daily - 7 x weekly - 3 sets - 10 reps - Sidelying Hip Abduction and Extension with Loop Band  - 1 x daily - 7 x weekly - 3 sets - 10 reps - Ankle Dorsiflexion with Resistance  - 1 x daily - 7 x  weekly - 3 sets - 10 reps - Ankle and Toe Plantarflexion with Resistance  - 1 x daily - 7 x weekly - 3 sets - 10 reps  Access Code: [email or web address] URL: [email or web address] Date: 02/18/2024 Prepared by: Sims Duck  Patient Education - Scar Massage  ASSESSMENT:  CLINICAL IMPRESSION:  Pt is progressing well with uneven surfaces and advanced balance training. She continues with deficits in Rt plantar flexion strength which makes stair negotiation the only thing causing her difficulty at this time  EVAL: Patient is a 48 y.o. female who was seen today for physical therapy evaluation and treatment for Rt achilles tendonitis s/p right haglund's resection and secondary achilles tendon repair with right plantar fasciotomy. She presents with decreased ROM and strength, impaired gait  and decreased functional mobility. Pt will benefit from skilled PT to address deficits and improve functional abilities in gait and stair negotiation.   OBJECTIVE IMPAIRMENTS: decreased activity tolerance, difficulty walking, decreased ROM, decreased strength, impaired flexibility, and pain.     GOALS: Goals reviewed with patient? Yes  SHORT TERM GOALS: Target date: 02/19/2024  Pt will be independent in initial HEP Baseline: Goal status: MET  2.  Pt will improve DF range on Rt ankle to >= 5 degrees Baseline:  02/18/24: 5 degrees Goal status: MET   LONG TERM GOALS: Target date: 03/18/2024  Pt will be independent with advanced HEP Baseline:  Goal status: INITIAL  2.  Pt will improve PSFS to <= 1 to demo improved functional mobility Baseline:  Goal status: INITIAL  3.  Pt will demo 4/5 PF strength on Rt LE Baseline:  Goal status: INITIAL  4.  Pt will tolerate descending a flight of stairs in a reciprocal pattern without railings with pain <= 1/10 Baseline:  Goal status: INITIAL   PLAN:  PT FREQUENCY: 1-2x/week  PT DURATION: 8 weeks  PLANNED INTERVENTIONS: 97164- PT Re-evaluation,  97110-Therapeutic exercises, 97530- Therapeutic activity, [id number]- Neuromuscular re-education, 97535- Self Care, 28413- Manual therapy, [id number]- Gait training, [id number]- Aquatic Therapy, [phone]- Electrical stimulation (unattended), 97016- Vasopneumatic device, [id number]- Ultrasound, [id number]- Ionotophoresis 4mg /ml Dexamethasone , Patient/Family education, Balance training, Stair training, Taping, Dry Needling, Cryotherapy, and Moist heat  PLAN FOR NEXT SESSION: check goals, d/c   Jamiere Gulas, PT 03/03/2024, 9:25 AM

## 2024-03-09 ENCOUNTER — Encounter: Payer: Self-pay | Admitting: Physical Therapy

## 2024-03-09 ENCOUNTER — Ambulatory Visit: Admitting: Physical Therapy

## 2024-03-09 DIAGNOSIS — R262 Difficulty in walking, not elsewhere classified: Secondary | ICD-10-CM

## 2024-03-09 DIAGNOSIS — I69918 Other symptoms and signs involving cognitive functions following unspecified cerebrovascular disease: Secondary | ICD-10-CM | POA: Diagnosis not present

## 2024-03-09 DIAGNOSIS — R200 Anesthesia of skin: Secondary | ICD-10-CM

## 2024-03-09 NOTE — Therapy (Signed)
 OUTPATIENT PHYSICAL THERAPY LOWER EXTREMITY TREATMENT AND DISCHARGE   Patient Name: Sandy Rodriguez MRN: [id number] DOB:11-26-1975, 48 y.o., female Today's Date: 03/09/2024  END OF SESSION:  PT End of Session - 03/09/24 0952     Visit Number 7    Date for PT Re-Evaluation 03/18/24    Authorization Type [employer]    Authorization - Visit Number 7    Authorization - Number of Visits 30    PT Start Time 0929    PT Stop Time 0952    PT Time Calculation (min) 23 min    Activity Tolerance Patient tolerated treatment well    Behavior During Therapy [facility] for tasks assessed/performed              History reviewed. No pertinent past medical history. Past Surgical History:  Procedure Laterality Date   BREAST REDUCTION SURGERY  09/30/1996   CHOLECYSTECTOMY     REDUCTION MAMMAPLASTY     Patient Active Problem List   Diagnosis Date Noted   Class 2 obesity due to excess calories without serious comorbidity with body mass index (BMI) of 37.0 to 37.9 in adult 02/06/2024   Left lumbar radiculitis 12/20/2022   Acute cough 09/27/2022   Viral upper respiratory tract infection 09/27/2022   Pilar cyst 03/08/2020   Callus of heel 04/13/2019   Sinus tarsitis, right 12/04/2018   Retrocalcaneal bursitis, right 12/12/2017   Pain at the base of the right fifth metatarsal 12/12/2017   Ankle pain, left 12/12/2017   Gastrocnemius muscle strain, right, initial encounter 11/06/2017   Onychomycosis 11/20/2016   Allergic sinusitis 06/14/2015   Class 1 obesity due to excess calories without serious comorbidity with body mass index (BMI) of 33.0 to 33.9 in adult 06/14/2015   Abnormal weight gain 06/22/2014   Constipation 06/22/2014   SKIN TAG 04/09/2010    PCP: Lindaann Requena  REFERRING PROVIDER: Alvah Auerbach  REFERRING DIAG: Rt achilles tendonitis  THERAPY DIAG:  Difficulty in walking, not elsewhere classified  Numbness of left foot  Other symptoms and signs involving cognitive functions following  unspecified cerebrovascular disease  Rationale for Evaluation and Treatment: Rehabilitation  ONSET DATE: 09/30/23  SUBJECTIVE:   SUBJECTIVE STATEMENT: Patient reports she is doing well. She still goes down stairs "flat foot'' but does not have pain. She has been able to walk up hills more easily  Pt is s/p right haglund's resection and secondary achilles tendon repair with right plantar fasciotomy on 09/30/23. She states she was initially NWB then progressed to wt bearing with crutches and wore a boot until March. She states that she still has difficulty going down stairs and that her gait doesn't feel "quite right". She does have some Rt ankle soreness with prolonged activity and she feels like her Rt ankle is "stiff".  PERTINENT HISTORY: Plantar fasciitis PAIN:  Are you having pain? Yes: NPRS scale: 0/10 currently, 2/10 at worst Pain location: Rt ankle/foot Pain description: stiff Aggravating factors: prolonged activity Relieving factors: compression sock, rest  PRECAUTIONS: None  RED FLAGS: None   WEIGHT BEARING RESTRICTIONS: No  FALLS:  Has patient fallen in last 6 months? No   OCCUPATION: works for [employer] at home  PLOF: Independent  PATIENT GOALS: be able to go down stairs without railing, walk faster  NEXT MD VISIT: 02/27/24  OBJECTIVE:  Note: Objective measures were completed at Evaluation unless otherwise noted.   PATIENT SURVEYS:  Patient specific functional scale: descending stairs = 5 (eval) PSFS: 2 (03/09/24)  PALPATION: Hypomobile TC and interphalangeal mobs  Rt Mild TTP around incision on achilles Mild edema noted  LOWER EXTREMITY ROM:  Active ROM Right eval Left eval Right 02/18/24 Right 03/09/24  Hip flexion      Hip extension      Hip abduction      Hip adduction      Hip internal rotation      Hip external rotation      Knee flexion      Knee extension      Ankle dorsiflexion 2 5 5 6   Ankle plantarflexion 55 71  61  Ankle inversion 18  40  30  Ankle eversion 11 30  20    (Blank rows = not tested)  LOWER EXTREMITY MMT:  MMT Right eval Left eval Right 6/10  Hip flexion     Hip extension     Hip abduction     Hip adduction     Hip internal rotation     Hip external rotation     Knee flexion     Knee extension     Ankle dorsiflexion 4+ 5 5  Ankle plantarflexion 3- 5 4  Ankle inversion 4+ 5 4+  Ankle eversion 4 5 4+   (Blank rows = not tested)    FUNCTIONAL TESTS:  SLS > 15 sec bilat Pt unable to perform SL heel raise on Rt. Unable to perform eccentric heel lowering due to pain  TUG: 8 seconds  GAIT:  Comments: decreased Rt DF during gait Pain with descending stairs leading with Rt foot - requires railing to descend stairs   [facility] Adult PT Treatment:                                                DATE: 03/09/24 Therapeutic Exercise: ROM and strength measurements  Therapeutic Activity: Stair negotiation Review HEP and recommendations for d/c PSFS 2   [facility] Adult PT Treatment:                                                DATE: 03/03/24 Therapeutic Exercise/Activity/NMR: Seated Alt towel scrunch and toe splay Toe yoga Ankle inv/ev AROM Ankle inv/ev green TB Standing heel raise with Rt wt shift --> eccentric lowering off 4'' step SL heel raise with UE support x 10 Gastroc and soleus stretching SL balance airex x 30 seconds SL on airex with 10#KB circles CW/CCW Lateral step up to airex foam on 6'' step with opp hip abd Runners step up onto airex foam on 6'' step Tandem walking forward/bkwd on airex beam Sidestep on airex beam SL pallof 5# x 10 bilat   [facility] Adult PT Treatment:                                                DATE: 02/18/2024 Therapeutic Exercise: STG update Seated: Alt towel scrunch & toe splay Toe yoga Ankle EV/INV AROM Resisted ankle PF/DF + GTB 2x15 each Standing gastroc & soleus stretches  Manual Therapy: IASTM gastroc/soleus Scar tissue massage along  incision Neuromuscular re-ed: Eccentric heel raises --> gradually increasing load on R LE Rt SLB solid  ground x30" --> airex 2x30" Foam beam: Side stepping Tandem walking fwd/bkwd                                                                                                                                PATIENT EDUCATION:  Education details: Updated HEP Person educated: Patient Education method: Explanation, Demonstration, and Handouts Education comprehension: verbalized understanding and returned demonstration  HOME EXERCISE PROGRAM: Access Code: [email or web address] URL: [email or web address] Date: 03/09/2024 Prepared by: Lowery Rue  Exercises - Gastroc Stretch on Wall  - 3-5 x daily - 7 x weekly - 1 sets - 3 reps - 20-30 seconds hold - Soleus Stretch on Wall  - 3-5 x daily - 7 x weekly - 1 sets - 3 reps - 20-30 seconds hold - Standing Heel Raise with Support  - 1 x daily - 7 x weekly - 2 sets - 10 reps - Standing Single Leg Heel Raise  - 1 x daily - 7 x weekly - 3 sets - 10 reps - Seated Ankle Plantarflexion Dorsiflexion PROM  - 1 x daily - 7 x weekly - 3 sets - 10 reps  ASSESSMENT:  CLINICAL IMPRESSION:  Pt has met goals and is independent with updated HEP. PT educated pt  on recommendations for continued balance work and overall fitness after d/c as well as HEP. Pt feels ready to d/c at this time  EVAL: Patient is a 48 y.o. female who was seen today for physical therapy evaluation and treatment for Rt achilles tendonitis s/p right haglund's resection and secondary achilles tendon repair with right plantar fasciotomy. She presents with decreased ROM and strength, impaired gait and decreased functional mobility. Pt will benefit from skilled PT to address deficits and improve functional abilities in gait and stair negotiation.   OBJECTIVE IMPAIRMENTS: decreased activity tolerance, difficulty walking, decreased ROM, decreased strength, impaired flexibility, and pain.      GOALS: Goals reviewed with patient? Yes  SHORT TERM GOALS: Target date: 02/19/2024  Pt will be independent in initial HEP Baseline: Goal status: MET  2.  Pt will improve DF range on Rt ankle to >= 5 degrees Baseline:  02/18/24: 5 degrees Goal status: MET   LONG TERM GOALS: Target date: 03/18/2024  Pt will be independent with advanced HEP Baseline:  Goal status: MET  2.  Pt will improve PSFS to <= 1 to demo improved functional mobility Baseline:  Goal status:  NOT MET  3.  Pt will demo 4/5 PF strength on Rt LE Baseline:  Goal status: MET  4.  Pt will tolerate descending a flight of stairs in a reciprocal pattern without railings with pain <= 1/10 Baseline:  Goal status: MET   PLAN:  PT FREQUENCY: 1-2x/week  PT DURATION: 8 weeks  PLANNED INTERVENTIONS: 97164- PT Re-evaluation, 97110-Therapeutic exercises, 97530- Therapeutic activity, [id number]- Neuromuscular re-education, 97535- Self Care, 16109- Manual therapy, [id number]- Gait training, [phone]- Aquatic Therapy, [phone]- Electrical stimulation (unattended), 97016- Vasopneumatic device,  84696- Ultrasound, 29528- Ionotophoresis 4mg /ml Dexamethasone , Patient/Family education, Balance training, Stair training, Taping, Dry Needling, Cryotherapy, and Moist heat  PLAN FOR NEXT SESSION:  d/c   Lashona Schaaf, PT 03/09/2024, 9:53 AM  PHYSICAL THERAPY DISCHARGE SUMMARY  Visits from Start of Care: 7  Current functional level related to goals / functional outcomes: Improved gait, strength, ROM   Remaining deficits: See above   Education / Equipment: HEP   Patient agrees to discharge. Patient goals were met. Patient is being discharged due to being pleased with the current functional level.

## 2024-03-29 ENCOUNTER — Ambulatory Visit: Payer: Self-pay

## 2024-03-29 ENCOUNTER — Telehealth (INDEPENDENT_AMBULATORY_CARE_PROVIDER_SITE_OTHER): Admitting: Family Medicine

## 2024-03-29 ENCOUNTER — Encounter: Payer: Self-pay | Admitting: Family Medicine

## 2024-03-29 DIAGNOSIS — N3000 Acute cystitis without hematuria: Secondary | ICD-10-CM | POA: Insufficient documentation

## 2024-03-29 MED ORDER — FLUCONAZOLE 150 MG PO TABS: 150.0000 mg | ORAL_TABLET | Freq: Once | ORAL | 0 refills | Status: AC

## 2024-03-29 MED ORDER — NITROFURANTOIN MONOHYD MACRO 100 MG PO CAPS: 100.0000 mg | ORAL_CAPSULE | Freq: Two times a day (BID) | ORAL | 0 refills | Status: AC

## 2024-03-29 NOTE — Telephone Encounter (Signed)
 FYI Only or Action Required?: FYI only for provider.  Patient was last seen in primary care on 02/06/2024 by Antoniette Vermell CROME, PA-C. Called Nurse Triage reporting Urinary Frequency and Dysuria. Symptoms began several days ago. Interventions attempted: OTC medications: AZO and Rest, hydration, or home remedies. Symptoms are: burning and pressure with urination, urinary frequencyunchanged.  Triage Disposition: See Physician Within 24 Hours  Patient/caregiver understands and will follow disposition?: Yes                        Copied from CRM [id number]. Topic: Clinical - Red Word Triage >> Mar 29, 2024  1:51 PM Brittney F wrote: [facility] that prompted transfer to Nurse Triage:   High frequency; burning sensation when urinated Reason for Disposition  All other patients with painful urination  (Exception: [1] EITHER frequency or urgency AND [2] has on-call doctor.)  Answer Assessment - Initial Assessment Questions 1. SEVERITY: How bad is the pain?  (e.g., Scale 1-10; mild, moderate, or severe)   - MILD (1-3): complains slightly about urination hurting   - MODERATE (4-7): interferes with normal activities     - SEVERE (8-10): excruciating, unwilling or unable to urinate because of the pain      Burning and pressure. 7/10. She states the pain does not last long.  2. FREQUENCY: How many times have you had painful urination today?      4-5 times.  3. PATTERN: Is pain present every time you urinate or just sometimes?      Every time.  4. ONSET: When did the painful urination start?      Saturday afternoon, 03/27/24.  5. FEVER: Do you have a fever? If Yes, ask: What is your temperature, how was it measured, and when did it start?     No, she states she has not felt feverish or checked temperature.  6. PAST UTI: Have you had a urine infection before? If Yes, ask: When was the last time? and What happened that time?      Yes, last time has been years  ago.  7. CAUSE: What do you think is causing the painful urination?  (e.g., UTI, scratch, Herpes sore)     UTI.  8. OTHER SYMPTOMS: Do you have any other symptoms? (e.g., blood in urine, flank pain, genital sores, urgency, vaginal discharge)     Patient denies nausea, vomiting, blood in urine, flank or back pain.  9. PREGNANCY: Is there any chance you are pregnant? When was your last menstrual period?     LMP: IUD in place.  Protocols used: Urination Pain - Female-A-AH

## 2024-03-29 NOTE — Assessment & Plan Note (Signed)
 Will cover empirically with macrobid and she will stop by to leave urine specimen to send for culture.  Fluconazole  sent in as well as she reports having yeast vaginitis after antibiotics.

## 2024-03-29 NOTE — Progress Notes (Signed)
 Sandy Rodriguez - 48 y.o. female MRN [id number]  Date of birth: 09-05-1976   All issues noted in this document were discussed and addressed.  No physical exam was performed (except for noted visual exam findings with Video Visits).  I discussed the limitations of evaluation and management by telemedicine and the availability of in person appointments. The patient expressed understanding and agreed to proceed.  I connected withNAME@ on 03/29/24 at  2:10 PM EDT by a video enabled telemedicine application and verified that I am speaking with the correct person using two identifiers.  Present at visit: Velma Ku, DO Carlyon JONETTA Cha   Patient Location: Home [address] [place] [address]   Provider location:   [facility]  No chief complaint on file.   HPI  Sandy Rodriguez is a 48 y.o. female who presents via audio/video conferencing for a telehealth visit today.  She reports urinary urgency, frequency and dysuria.  Symptoms started about 2-3 days ago.  Has tried Azo with some improvement.  Denies fever, chills or flank pain.     ROS:  A comprehensive ROS was completed and negative except as noted per HPI  No past medical history on file.  Past Surgical History:  Procedure Laterality Date   BREAST REDUCTION SURGERY  09/30/1996   CHOLECYSTECTOMY     REDUCTION MAMMAPLASTY      Family History  Problem Relation Age of Onset   Colon polyps Mother    Hypertension Mother    Cancer Father        non hodgkins lymphoma   Stroke Paternal Aunt    Heart attack Maternal Grandfather    Stroke Paternal Grandmother    Colon cancer Neg Hx    Crohn's disease Neg Hx    Esophageal cancer Neg Hx    Rectal cancer Neg Hx    Stomach cancer Neg Hx    Ulcerative colitis Neg Hx     Social History   Socioeconomic History   Marital status: Married    Spouse name: Not on file   Number of children: Not on file   Years of education: Not on file   Highest education level: Not on file   Occupational History   Not on file  Tobacco Use   Smoking status: Never   Smokeless tobacco: Never  Vaping Use   Vaping status: Never Used  Substance and Sexual Activity   Alcohol use: Yes    Comment: occasionally   Drug use: No   Sexual activity: Yes  Other Topics Concern   Not on file  Social History Narrative   Not on file   Social Drivers of Health   Financial Resource Strain: Not on file  Food Insecurity: Not on file  Transportation Needs: Not on file  Physical Activity: Not on file  Stress: Not on file  Social Connections: Unknown (02/07/2022)   Received from [facility]   Social Network    Social Network: Not on file  Intimate Partner Violence: Unknown (01/02/2022)   Received from [facility]   HITS    Physically Hurt: Not on file    Insult or Talk Down To: Not on file    Threaten Physical Harm: Not on file    Scream or Curse: Not on file     Current Outpatient Medications:    fluconazole  (DIFLUCAN ) 150 MG tablet, Take 1 tablet (150 mg total) by mouth once for 1 dose. May repeat after 72 hours., Disp: 2 tablet, Rfl: 0  nitrofurantoin, macrocrystal-monohydrate, (MACROBID) 100 MG capsule, Take 1 capsule (100 mg total) by mouth 2 (two) times daily., Disp: 14 capsule, Rfl: 0   CALCIUM PO, Take 600 mg by mouth daily., Disp: , Rfl:    levonorgestrel  (MIRENA , 52 MG,) 20 MCG/24HR IUD, Mirena  20 mcg/24 hours (6 yrs) 52 mg intrauterine device  Take 1 device by intrauterine route., Disp: , Rfl:    Multiple Vitamin (MULTIVITAMIN) tablet, Take 1 tablet by mouth daily., Disp: , Rfl:    tirzepatide  (ZEPBOUND ) 2.5 MG/0.5ML Pen, Inject 2.5 mg into the skin once a week., Disp: 2 mL, Rfl: 0   tirzepatide  (ZEPBOUND ) 5 MG/0.5ML Pen, Inject 5 mg into the skin once a week., Disp: 2 mL, Rfl: 0  EXAM:  VITALS per patient if applicable: There were no vitals taken for this visit.  GENERAL: alert, oriented, appears well and in no acute distress  HEENT: atraumatic, conjunttiva  clear, no obvious abnormalities on inspection of external nose and ears  NECK: normal movements of the head and neck  LUNGS: on inspection no signs of respiratory distress, breathing rate appears normal, no obvious gross SOB, gasping or wheezing  CV: no obvious cyanosis  MS: moves all visible extremities without noticeable abnormality  PSYCH/NEURO: pleasant and cooperative, no obvious depression or anxiety, speech and thought processing grossly intact  ASSESSMENT AND PLAN:  Discussed the following assessment and plan:  Acute cystitis Will cover empirically with macrobid and she will stop by to leave urine specimen to send for culture.  Fluconazole  sent in as well as she reports having yeast vaginitis after antibiotics.      I discussed the assessment and treatment plan with the patient. The patient was provided an opportunity to ask questions and all were answered. The patient agreed with the plan and demonstrated an understanding of the instructions.   The patient was advised to call back or seek an in-person evaluation if the symptoms worsen or if the condition fails to improve as anticipated.    Velma Ku, DO

## 2024-06-01 ENCOUNTER — Encounter: Payer: Self-pay | Admitting: Sports Medicine
